# Patient Record
Sex: Male | Born: 1960 | Race: White | Hispanic: No | Marital: Married | Smoking: Former smoker
Health system: Southern US, Community
[De-identification: ages and names within clinical notes are randomized; demographics above are authoritative.]

## PROBLEM LIST (undated history)

## (undated) DIAGNOSIS — E119 Type 2 diabetes mellitus without complications: Secondary | ICD-10-CM

## (undated) DIAGNOSIS — K219 Gastro-esophageal reflux disease without esophagitis: Secondary | ICD-10-CM

## (undated) DIAGNOSIS — E785 Hyperlipidemia, unspecified: Secondary | ICD-10-CM

## (undated) DIAGNOSIS — Z95820 Peripheral vascular angioplasty status with implants and grafts: Secondary | ICD-10-CM

## (undated) DIAGNOSIS — I2510 Atherosclerotic heart disease of native coronary artery without angina pectoris: Secondary | ICD-10-CM

## (undated) DIAGNOSIS — I10 Essential (primary) hypertension: Secondary | ICD-10-CM

## (undated) DIAGNOSIS — G4730 Sleep apnea, unspecified: Secondary | ICD-10-CM

## (undated) DIAGNOSIS — Z77090 Contact with and (suspected) exposure to asbestos: Secondary | ICD-10-CM

## (undated) HISTORY — DX: Sleep apnea, unspecified: G47.30

## (undated) HISTORY — PX: CORONARY ANGIOPLASTY WITH STENT PLACEMENT: SHX49

## (undated) HISTORY — PX: ANKLE ARTHROSCOPY: SUR85

---

## 2000-08-19 ENCOUNTER — Encounter: Payer: Self-pay | Admitting: Family Medicine

## 2000-08-19 ENCOUNTER — Encounter: Admission: RE | Admit: 2000-08-19 | Discharge: 2000-08-19 | Payer: Self-pay | Admitting: Family Medicine

## 2008-06-01 ENCOUNTER — Encounter: Payer: Self-pay | Admitting: Internal Medicine

## 2008-07-05 ENCOUNTER — Encounter: Payer: Self-pay | Admitting: Internal Medicine

## 2008-07-09 ENCOUNTER — Ambulatory Visit: Payer: Self-pay | Admitting: Internal Medicine

## 2008-07-09 DIAGNOSIS — R05 Cough: Secondary | ICD-10-CM

## 2008-07-09 DIAGNOSIS — Z77090 Contact with and (suspected) exposure to asbestos: Secondary | ICD-10-CM

## 2008-07-09 DIAGNOSIS — F17200 Nicotine dependence, unspecified, uncomplicated: Secondary | ICD-10-CM

## 2008-07-09 DIAGNOSIS — R059 Cough, unspecified: Secondary | ICD-10-CM | POA: Insufficient documentation

## 2008-07-09 DIAGNOSIS — Z9189 Other specified personal risk factors, not elsewhere classified: Secondary | ICD-10-CM

## 2008-07-09 DIAGNOSIS — R0602 Shortness of breath: Secondary | ICD-10-CM

## 2008-07-15 ENCOUNTER — Encounter: Payer: Self-pay | Admitting: Internal Medicine

## 2008-08-20 ENCOUNTER — Ambulatory Visit: Payer: Self-pay | Admitting: Cardiology

## 2008-09-02 ENCOUNTER — Ambulatory Visit: Payer: Self-pay | Admitting: Internal Medicine

## 2008-09-02 DIAGNOSIS — J3489 Other specified disorders of nose and nasal sinuses: Secondary | ICD-10-CM | POA: Insufficient documentation

## 2010-04-11 ENCOUNTER — Telehealth: Payer: Self-pay | Admitting: Internal Medicine

## 2010-04-20 NOTE — Progress Notes (Signed)
Summary: return xray films to [facility]  Phone Note Outgoing Call   Summary of Call: MR had xray films from [facility] for this pt. I have given them to Juanita to have the carrieer return them to [facility].  Initial call taken by: Carron Curie CMA,  April 11, 2010 9:53 AM

## 2010-05-13 IMAGING — CT CT CHEST W/O CM
2 of 6 series · 12 of 36 positions shown, 15 images · non-contrast
Comparison: None

CLINICAL DATA: Shortness of breath, cough.

CT CHEST WITHOUT CONTRAST
TECHNIQUE: Multidetector CT imaging of the chest was performed
following the standard protocol without IV contrast.

[Series 5: lung · axial · 0.73mm/px · z∈[-118,+116]mm · 9 of 59 slices shown, 12 images]
[im 6/59  mediastinal]
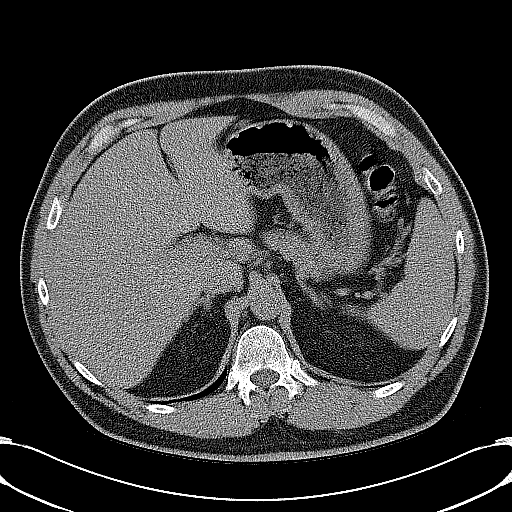
[im 6/59  lung]
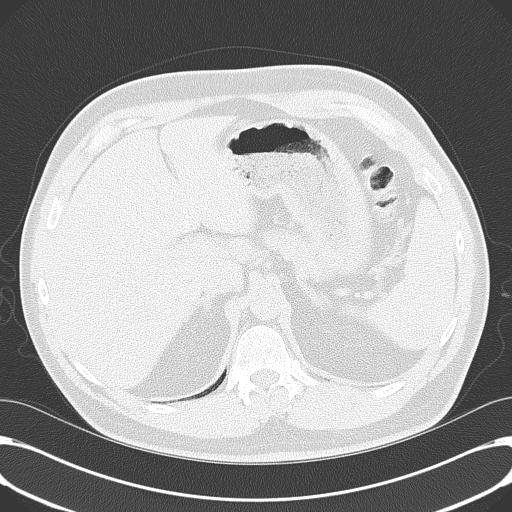
[im 12/59  lung]
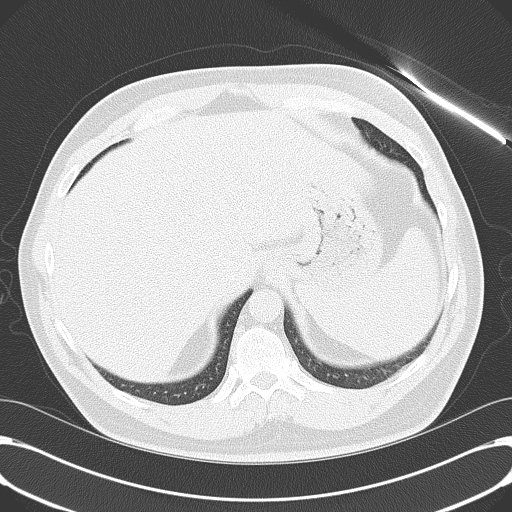
[im 18/59  lung]
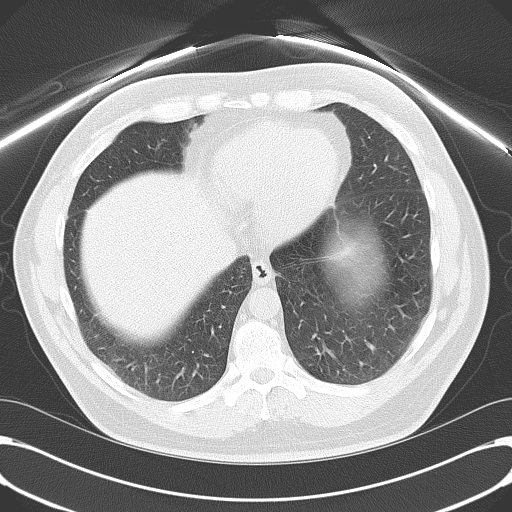
[im 24/59  lung]
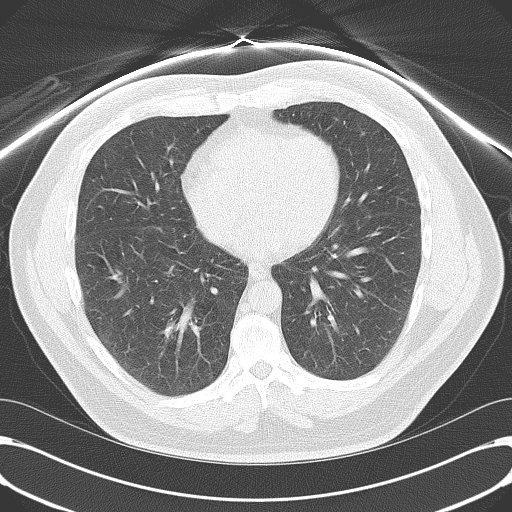
[im 30/59  mediastinal]
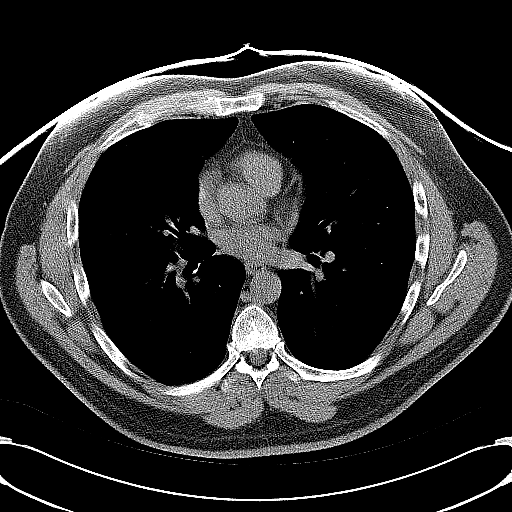
[im 30/59  lung]
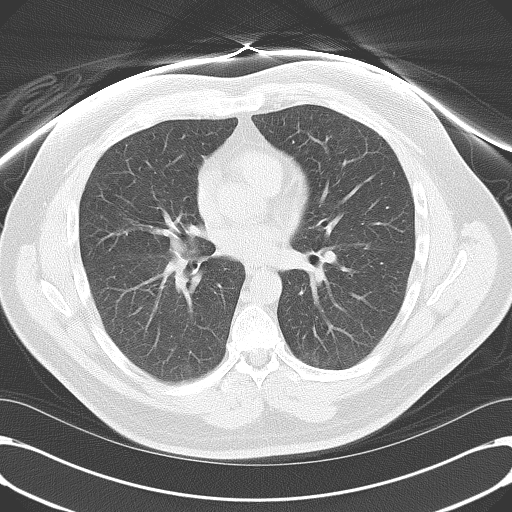
[im 35/59  lung]
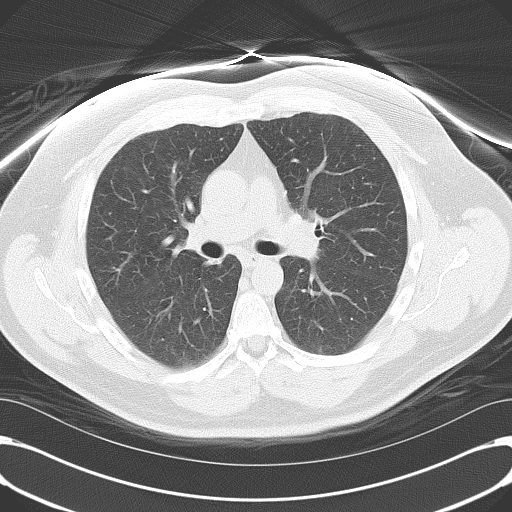
[im 41/59  lung]
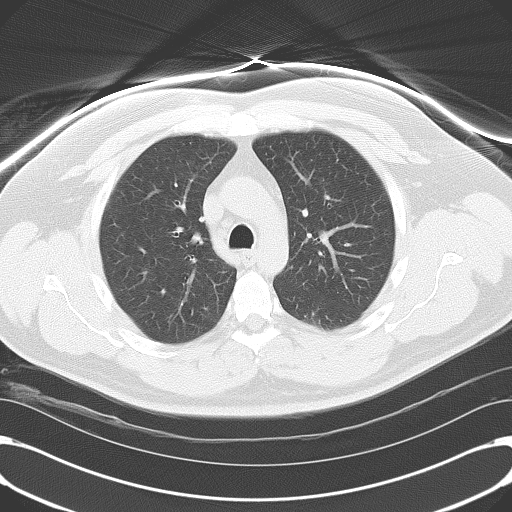
[im 47/59  lung]
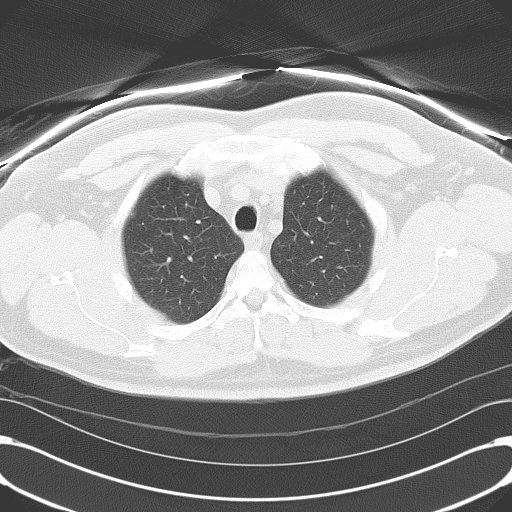
[im 53/59  mediastinal]
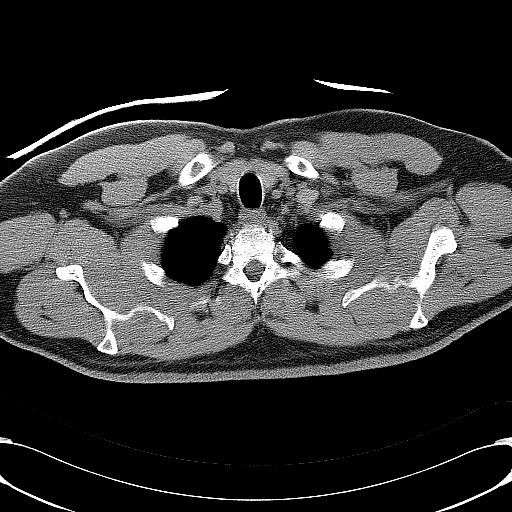
[im 53/59  lung]
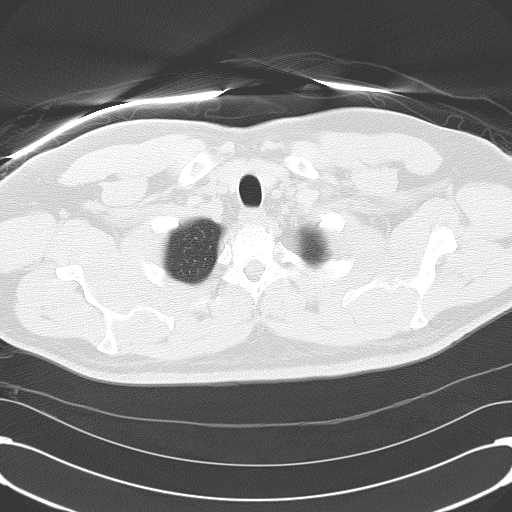

[Series 602: <mpr thick range> · coronal · 0.73mm/px · 3 of 126 slices shown]
[im 26/126  lung]
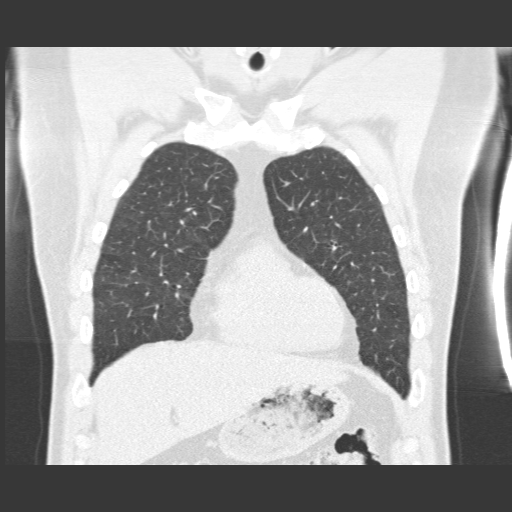
[im 51/126  lung]
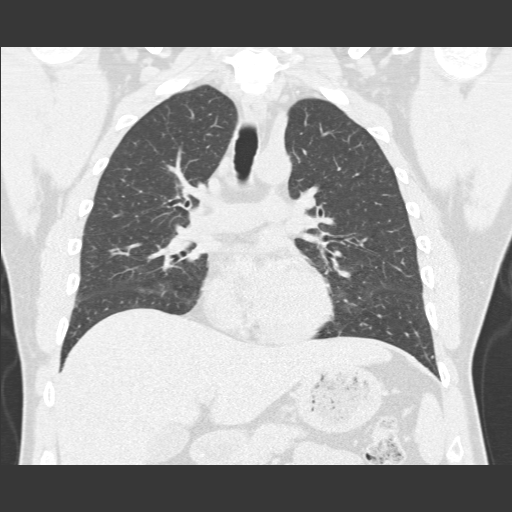
[im 76/126  lung]
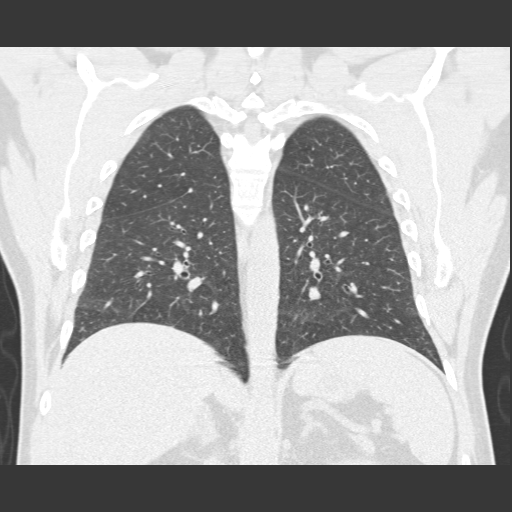

[12 of 36 positions shown; findings below may reference images not displayed]

FINDINGS: The lungs are clear except for minimal ground-glass
densities posteriorly, likely dependent atelectasis.  High
resolution images demonstrate no significant interstitial lung
disease. Heart is normal size. Aorta is normal caliber. No
mediastinal, hilar, or axillary adenopathy.  No pleural effusion.
Visualized thyroid and chest wall soft tissues unremarkable.

Imaging into the upper abdomen shows no acute findings.  No acute
bony abnormality.
IMPRESSION: No significant abnormality.

## 2015-11-24 ENCOUNTER — Emergency Department (HOSPITAL_COMMUNITY): Payer: 59

## 2015-11-24 ENCOUNTER — Encounter (HOSPITAL_COMMUNITY): Payer: Self-pay | Admitting: *Deleted

## 2015-11-24 ENCOUNTER — Inpatient Hospital Stay (HOSPITAL_COMMUNITY)
Admission: EM | Admit: 2015-11-24 | Discharge: 2015-11-26 | DRG: 247 | Disposition: A | Discharge status: Home / Self Care | Payer: 59 | Attending: Internal Medicine | Admitting: Internal Medicine

## 2015-11-24 DIAGNOSIS — I214 Non-ST elevation (NSTEMI) myocardial infarction: Principal | ICD-10-CM | POA: Diagnosis present

## 2015-11-24 DIAGNOSIS — Z8249 Family history of ischemic heart disease and other diseases of the circulatory system: Secondary | ICD-10-CM

## 2015-11-24 DIAGNOSIS — E119 Type 2 diabetes mellitus without complications: Secondary | ICD-10-CM | POA: Diagnosis present

## 2015-11-24 DIAGNOSIS — Z833 Family history of diabetes mellitus: Secondary | ICD-10-CM

## 2015-11-24 DIAGNOSIS — E1065 Type 1 diabetes mellitus with hyperglycemia: Secondary | ICD-10-CM | POA: Diagnosis not present

## 2015-11-24 DIAGNOSIS — Z77090 Contact with and (suspected) exposure to asbestos: Secondary | ICD-10-CM | POA: Diagnosis present

## 2015-11-24 DIAGNOSIS — E1069 Type 1 diabetes mellitus with other specified complication: Secondary | ICD-10-CM | POA: Diagnosis not present

## 2015-11-24 DIAGNOSIS — Z794 Long term (current) use of insulin: Secondary | ICD-10-CM

## 2015-11-24 DIAGNOSIS — R079 Chest pain, unspecified: Secondary | ICD-10-CM | POA: Diagnosis present

## 2015-11-24 DIAGNOSIS — K219 Gastro-esophageal reflux disease without esophagitis: Secondary | ICD-10-CM | POA: Diagnosis not present

## 2015-11-24 DIAGNOSIS — Z95820 Peripheral vascular angioplasty status with implants and grafts: Secondary | ICD-10-CM

## 2015-11-24 DIAGNOSIS — Z79899 Other long term (current) drug therapy: Secondary | ICD-10-CM

## 2015-11-24 DIAGNOSIS — I2510 Atherosclerotic heart disease of native coronary artery without angina pectoris: Secondary | ICD-10-CM | POA: Diagnosis present

## 2015-11-24 DIAGNOSIS — Z7982 Long term (current) use of aspirin: Secondary | ICD-10-CM

## 2015-11-24 DIAGNOSIS — IMO0001 Reserved for inherently not codable concepts without codable children: Secondary | ICD-10-CM | POA: Diagnosis present

## 2015-11-24 DIAGNOSIS — Z23 Encounter for immunization: Secondary | ICD-10-CM

## 2015-11-24 DIAGNOSIS — E1165 Type 2 diabetes mellitus with hyperglycemia: Secondary | ICD-10-CM

## 2015-11-24 DIAGNOSIS — F17200 Nicotine dependence, unspecified, uncomplicated: Secondary | ICD-10-CM | POA: Diagnosis present

## 2015-11-24 DIAGNOSIS — E781 Pure hyperglyceridemia: Secondary | ICD-10-CM | POA: Diagnosis present

## 2015-11-24 DIAGNOSIS — I10 Essential (primary) hypertension: Secondary | ICD-10-CM | POA: Diagnosis present

## 2015-11-24 HISTORY — DX: Essential (primary) hypertension: I10

## 2015-11-24 HISTORY — DX: Gastro-esophageal reflux disease without esophagitis: K21.9

## 2015-11-24 HISTORY — DX: Atherosclerotic heart disease of native coronary artery without angina pectoris: I25.10

## 2015-11-24 HISTORY — DX: Type 2 diabetes mellitus without complications: E11.9

## 2015-11-24 HISTORY — DX: Contact with and (suspected) exposure to asbestos: Z77.090

## 2015-11-24 HISTORY — DX: Hyperlipidemia, unspecified: E78.5

## 2015-11-24 HISTORY — DX: Peripheral vascular angioplasty status with implants and grafts: Z95.820

## 2015-11-24 LAB — TROPONIN I
Troponin I: 0.05 ng/mL (ref <0.03)
Troponin I: 0.05 ng/mL (ref <0.03)

## 2015-11-24 LAB — COMPREHENSIVE METABOLIC PANEL
ALBUMIN: 4.1 g/dL (ref 3.5–5.0)
ALK PHOS: 90 U/L (ref 38–126)
ALT: 16 U/L — ABNORMAL LOW (ref 17–63)
ANION GAP: 11 (ref 5–15)
AST: 18 U/L (ref 15–41)
BILIRUBIN TOTAL: 0.7 mg/dL (ref 0.3–1.2)
BUN: 13 mg/dL (ref 6–20)
CALCIUM: 9.2 mg/dL (ref 8.9–10.3)
CO2: 23 mmol/L (ref 22–32)
Chloride: 99 mmol/L — ABNORMAL LOW (ref 101–111)
Creatinine, Ser: 1.48 mg/dL — ABNORMAL HIGH (ref 0.61–1.24)
GFR calc Af Amer: >60 mL/min (ref >60)
GFR, EST NON AFRICAN AMERICAN: 52 mL/min — AB (ref >60)
GLUCOSE: 347 mg/dL — AB (ref 65–99)
Potassium: 5.3 mmol/L — ABNORMAL HIGH (ref 3.5–5.1)
Sodium: 133 mmol/L — ABNORMAL LOW (ref 135–145)
TOTAL PROTEIN: 6.6 g/dL (ref 6.5–8.1)

## 2015-11-24 LAB — LIPASE, BLOOD: LIPASE: 27 U/L (ref 11–51)

## 2015-11-24 LAB — CBC
HEMATOCRIT: 49 % (ref 39.0–52.0)
HEMOGLOBIN: 17.3 g/dL — AB (ref 13.0–17.0)
MCH: 30 pg (ref 26.0–34.0)
MCHC: 35.3 g/dL (ref 30.0–36.0)
MCV: 85.1 fL (ref 78.0–100.0)
Platelets: 178 10*3/uL (ref 150–400)
RBC: 5.76 MIL/uL (ref 4.22–5.81)
RDW: 13.4 % (ref 11.5–15.5)
WBC: 10 10*3/uL (ref 4.0–10.5)

## 2015-11-24 LAB — I-STAT TROPONIN, ED: Troponin i, poc: 0.01 ng/mL (ref 0.00–0.08)

## 2015-11-24 LAB — GLUCOSE, CAPILLARY
Glucose-Capillary: 264 mg/dL — ABNORMAL HIGH (ref 65–99)
Glucose-Capillary: 240 mg/dL — ABNORMAL HIGH (ref 65–99)

## 2015-11-24 MED ORDER — INSULIN ASPART 100 UNIT/ML ~~LOC~~ SOLN
0.0000 [IU] | Freq: Every day | SUBCUTANEOUS | Status: DC
  Administered 2015-11-24 – 2015-11-25 (×2): 2 [IU] via SUBCUTANEOUS

## 2015-11-24 MED ORDER — ASPIRIN 81 MG PO CHEW
324.0000 mg | CHEWABLE_TABLET | Freq: Once | ORAL | Status: AC
  Administered 2015-11-24: 324 mg via ORAL
  Filled 2015-11-24: qty 4

## 2015-11-24 MED ORDER — FAMOTIDINE 20 MG PO TABS
20.0000 mg | ORAL_TABLET | Freq: Two times a day (BID) | ORAL | Status: DC
  Filled 2015-11-24 (×4): qty 1

## 2015-11-24 MED ORDER — INSULIN GLARGINE 100 UNIT/ML ~~LOC~~ SOLN
25.0000 [IU] | Freq: Every day | SUBCUTANEOUS | Status: DC
  Administered 2015-11-24 – 2015-11-26 (×2): 25 [IU] via SUBCUTANEOUS
  Filled 2015-11-24 (×3): qty 0.25

## 2015-11-24 MED ORDER — ENOXAPARIN SODIUM 40 MG/0.4ML ~~LOC~~ SOLN: 40.0000 mg | SUBCUTANEOUS | Status: DC

## 2015-11-24 MED ORDER — NICOTINE 21 MG/24HR TD PT24
21.0000 mg | MEDICATED_PATCH | Freq: Every day | TRANSDERMAL | Status: DC
  Filled 2015-11-24 (×2): qty 1

## 2015-11-24 MED ORDER — ONDANSETRON HCL 4 MG/2ML IJ SOLN
4.0000 mg | Freq: Once | INTRAMUSCULAR | Status: AC
  Administered 2015-11-24: 4 mg via INTRAVENOUS
  Filled 2015-11-24: qty 2

## 2015-11-24 MED ORDER — GI COCKTAIL ~~LOC~~: 30.0000 mL | Freq: Once | ORAL | Status: DC

## 2015-11-24 MED ORDER — INSULIN GLARGINE-LIXISENATIDE 100-33 UNT-MCG/ML ~~LOC~~ SOPN: 25.0000 [IU] | PEN_INJECTOR | Freq: Every day | SUBCUTANEOUS | Status: DC

## 2015-11-24 MED ORDER — ONDANSETRON HCL 4 MG/2ML IJ SOLN: 4.0000 mg | Freq: Four times a day (QID) | INTRAMUSCULAR | Status: DC | PRN

## 2015-11-24 MED ORDER — GI COCKTAIL ~~LOC~~: 30.0000 mL | Freq: Four times a day (QID) | ORAL | Status: DC | PRN

## 2015-11-24 MED ORDER — INFLUENZA VAC SPLIT QUAD 0.5 ML IM SUSY: 0.5000 mL | PREFILLED_SYRINGE | INTRAMUSCULAR | Status: DC

## 2015-11-24 MED ORDER — ASPIRIN EC 81 MG PO TBEC
81.0000 mg | DELAYED_RELEASE_TABLET | Freq: Every day | ORAL | Status: DC
  Administered 2015-11-25 – 2015-11-26 (×2): 81 mg via ORAL
  Filled 2015-11-24 (×2): qty 1

## 2015-11-24 MED ORDER — ACETAMINOPHEN 325 MG PO TABS: 650.0000 mg | ORAL_TABLET | ORAL | Status: DC | PRN

## 2015-11-24 MED ORDER — SODIUM CHLORIDE 0.9 % IV SOLN
INTRAVENOUS | Status: DC
  Administered 2015-11-24: 18:00:00 via INTRAVENOUS

## 2015-11-24 MED ORDER — INSULIN ASPART 100 UNIT/ML ~~LOC~~ SOLN
0.0000 [IU] | Freq: Three times a day (TID) | SUBCUTANEOUS | Status: DC
Start: 2015-11-24 — End: 2015-11-26
  Administered 2015-11-24: 5 [IU] via SUBCUTANEOUS

## 2015-11-24 NOTE — ED Triage Notes (Signed)
Pt arrives from home via [facility]. Pt called out for substernal CP gradual onset at 0830. Pt states he attempted to take ASA, but had one episode of n/v and was  Unable to tolerate. Upon EMS arrival pt denied CP and states all other sx have resolved.

## 2015-11-24 NOTE — ED Provider Notes (Signed)
[facility]-EMERGENCY DEPT Provider Note   CSN: [id number] Arrival date & time: 11/24/15  [id number]     History   Chief Complaint Chief Complaint  Patient presents with  . Chest Pain    HPI Christopher Cooper is a 55 y.o. male.  The history is provided by the patient.  Chest Pain   This is a new problem. The current episode started 1 to 2 hours ago. The problem occurs constantly. The problem has been resolved. Associated with: following a BM. The pain is present in the substernal region. The pain is moderate. The quality of the pain is described as pressure-like. The pain does not radiate. Associated symptoms include diaphoresis, shortness of breath and vomiting. Pertinent negatives include no abdominal pain, no back pain, no cough, no fever, no headaches, no leg pain, no lower extremity edema, no malaise/fatigue, no nausea and no palpitations. Risk factors include male gender, being elderly and smoking/tobacco exposure.  His past medical history is significant for diabetes and hypertension.  Pertinent negatives for past medical history include no CAD, no hyperlipidemia, no MI, no PE and no seizures.  Procedure history is positive for exercise treadmill test (several years ago).    Past Medical History:  Diagnosis Date  . Hypertension     Patient Active Problem List   Diagnosis Date Noted  . Chest pain 11/24/2015  . Diabetes mellitus, insulin dependent (IDDM), uncontrolled (HCC) 11/24/2015  . GERD (gastroesophageal reflux disease) 11/24/2015  . OTHER DISEASES OF NASAL CAVITY AND SINUSES 09/02/2008  . TOBACCO ABUSE 07/09/2008  . SHORTNESS OF BREATH 07/09/2008  . COUGH 07/09/2008  . HISTORY OF ASBESTOS EXPOSURE 07/09/2008  . SNORING, HX OF 07/09/2008    History reviewed. No pertinent surgical history.     Home Medications    Prior to Admission medications   Medication Sig Start Date End Date Taking? Authorizing Provider  aspirin EC 81 MG tablet Take 81 mg by mouth daily.   Yes  Historical Provider, MD  Insulin Glargine-Lixisenatide (SOLIQUA) 100-33 UNT-MCG/ML SOPN Inject 25 Units into the skin daily.   Yes Historical Provider, MD  insulin lispro (HUMALOG) 100 UNIT/ML injection Inject 20 Units into the skin 2 (two) times daily.   Yes Historical Provider, MD  metFORMIN (GLUCOPHAGE) 500 MG tablet Take 500 mg by mouth 2 (two) times daily with a meal.   Yes Historical Provider, MD  ranitidine (ZANTAC) 150 MG capsule Take 150 mg by mouth daily as needed for heartburn.   Yes Historical Provider, MD  VIAGRA 100 MG tablet Take 100 mg by mouth daily as needed. 08/20/15  Yes Historical Provider, MD    Family History Family History  Problem Relation Age of Onset  . Diabetes Mellitus II Mother   . Diabetes Mellitus II Father   . CAD Other     Reports multiple distant relatives with history of MI    Social History Social History  Substance Use Topics  . Smoking status: Not on file  . Smokeless tobacco: Not on file  . Alcohol use Not on file     Allergies   Review of patient's allergies indicates no known allergies.   Review of Systems Review of Systems  Constitutional: Positive for diaphoresis. Negative for chills, fever and malaise/fatigue.  HENT: Negative for ear pain and sore throat.   Eyes: Negative for pain and visual disturbance.  Respiratory: Positive for shortness of breath. Negative for cough.   Cardiovascular: Positive for chest pain. Negative for palpitations.  Gastrointestinal: Positive for vomiting.  Negative for abdominal pain and nausea.  Genitourinary: Negative for dysuria and hematuria.  Musculoskeletal: Negative for arthralgias and back pain.  Skin: Negative for color change and rash.  Neurological: Negative for seizures, syncope and headaches.  All other systems reviewed and are negative.    Physical Exam Updated Vital Signs BP 122/87 (BP Location: Right Arm)   Pulse 89   Temp 98.1 F (36.7 C)   Resp 18   Ht 5\' 6"  (1.676 m)   Wt 180  lb (81.6 kg)   SpO2 92%   BMI 29.05 kg/m   Physical Exam  Constitutional: He is oriented to person, place, and time. He appears well-developed and well-nourished. No distress.  HENT:  Head: Normocephalic and atraumatic.  Nose: Nose normal.  Eyes: Conjunctivae and EOM are normal. Pupils are equal, round, and reactive to light. Right eye exhibits no discharge. Left eye exhibits no discharge. No scleral icterus.  Neck: Normal range of motion. Neck supple.  Cardiovascular: Normal rate and regular rhythm.  Exam reveals no gallop and no friction rub.   No murmur heard. Pulmonary/Chest: Effort normal and breath sounds normal. No stridor. No respiratory distress. He has no rales.  Abdominal: Soft. He exhibits no distension. There is no tenderness.  Musculoskeletal: He exhibits no edema or tenderness.  Neurological: He is alert and oriented to person, place, and time.  Skin: Skin is warm and dry. No rash noted. He is not diaphoretic. No erythema.  Psychiatric: He has a normal mood and affect.  Vitals reviewed.    ED Treatments / Results  Labs (all labs ordered are listed, but only abnormal results are displayed) Labs Reviewed  CBC - Abnormal; Notable for the following:       Result Value   Hemoglobin 17.3 (*)    All other components within normal limits  COMPREHENSIVE METABOLIC PANEL - Abnormal; Notable for the following:    Sodium 133 (*)    Potassium 5.3 (*)    Chloride 99 (*)    Glucose, Bld 347 (*)    Creatinine, Ser 1.48 (*)    ALT 16 (*)    GFR calc non Af Amer 52 (*)    All other components within normal limits  LIPASE, BLOOD  HEMOGLOBIN A1C  TROPONIN I  TROPONIN I  TROPONIN I  I-STAT TROPOININ, ED    EKG  EKG Interpretation  Date/Time:  Thursday November 24 2015 09:59:04 EDT Ventricular Rate:  80 PR Interval:    QRS Duration: 99 QT Interval:  375 QTC Calculation: 433 R Axis:   74 Text Interpretation:  Sinus rhythm Nonspecific T wave abnormality No old tracing  to compare Confirmed by [facility] MD, PEDRO [phone]) on 11/24/2015 4:05:06 PM       Radiology Dg Chest 2 View  Result Date: 11/24/2015 CLINICAL DATA:  Chest pain and nausea beginning this morning. EXAM: CHEST  2 VIEW COMPARISON:  Chest CT scan 08/20/2008. FINDINGS: The lungs are clear. No pneumothorax or pleural effusion. Heart size is normal. No focal bony abnormality. IMPRESSION: No acute disease. Electronically Signed   By: Drusilla Kanner M.D.   On: 11/24/2015 11:28    Procedures Procedures (including critical care time)  Medications Ordered in ED Medications  0.9 %  sodium chloride infusion (not administered)  insulin aspart (novoLOG) injection 0-9 Units (not administered)  insulin aspart (novoLOG) injection 0-5 Units (not administered)  nicotine (NICODERM CQ - dosed in mg/24 hours) patch 21 mg (not administered)  gi cocktail (Maalox,Lidocaine,Donnatal) (not administered)  aspirin chewable tablet 324 mg (324 mg Oral Given 11/24/15 1130)  ondansetron (ZOFRAN) injection 4 mg (4 mg Intravenous Given 11/24/15 1130)     Initial Impression / Assessment and Plan / ED Course  I have reviewed the triage vital signs and the nursing notes.  Pertinent labs & imaging results that were available during my care of the patient were reviewed by me and considered in my medical decision making (see chart for details).  Clinical Course    Atypical chest pain. EKG with nonspecific T wave changes; no evidence of pericarditis. Initial Trop negative. HEAR >4. Will require admission for ACS rule out. Pain free on arrival. Given ASA.   Chest x-ray without evidence suggestive of pneumonia, pneumothorax, pneumomediastinum.  No abnormal contour of the mediastinum to suggest dissection. No evidence of acute injuries. Presentation is classic for dissection or esophageal perforation. Low pretest probability for pulmonary embolism.  Appreciate admission to hospitalist service for continued workup.  Final  Clinical Impressions(s) / ED Diagnoses   Final diagnoses:  Chest pain    Disposition: Admit  Condition: Stable    Nira Conn, MD 11/24/15 1606

## 2015-11-24 NOTE — H&P (Signed)
History and Physical    Christopher ForemanRiley D Kley NWG:[id number] DOB: 02/25/1960 DOA: 11/24/2015   PCP: Pearson GrippeJames Kim, MD   Patient coming from/Resides with: Private residence/lives with wife  Admission status: Observation/telemetry --will need to be reevaluated in 24 hours to determine if it will be medically necessary to stay a minimum 2 midnights to rule out impending and/or unexpected changes in physiologic status that may differ from initial evaluation performed in the ER and/or at time of admission. Patient admitted with atypical chest pain of short duration with heart score of 3. Cardiac risk factors include ongoing tobacco abuse, diabetes and male sex. Plan to cycle troponin and perform echocardiogram; if no significant findings will discharge within 24 hours.  Chief Complaint: Chest pain  HPI: Christopher Cooper is a 55 y.o. male with medical history significant for diabetes on insulin, ongoing tobacco abuse, severe GERD, history of exposure to asbestos at work remotely. Patient reports that this morning he got out of bed quickly to go to the bathroom and began having a bowel movement when he began having discomfort in the upper chest just below the neck that did not radiate. He reports "not feeling right" and decided to check his fitness watch which documented a heart rate of 42. At this point he became diaphoretic. He got off the toilet and called EMS who instructed him to chew 4 baby aspirin at which point he became nauseous and then developed emesis. After vomiting he became somewhat short of breath. He states the chest pain lasts about 15-20 minutes. He has not had chest pain with either exertional or rest nor dyspnea with exertion or rest prior to today's episode. EMS was called to the home and patient was transported to the ER where he was chest pain-free and EKG was unremarkable.  ED Course:  Vital Signs: BP 102/63   Pulse 69   Temp 98.1 F (36.7 C)   Resp 16   Ht 5\' 6"  (1.676 m)   Wt 81.6 kg  (180 lb)   SpO2 94%   BMI 29.05 kg/m  2 view CXR: No acute disease Lab data: Sodium 133, potassium 5.3, chloride 99, BUN 13, creatinine 1.48, glucose 347, LFTs normal, POC troponin 0.01, W BCs 10,000 differential not obtained, hemoglobin 17.3, platelets 178,000 Medications and treatments: Aspirin 324 mg 1, Zofran 4 mg IV 1  Review of Systems:  In addition to the HPI above,  No Fever-chills, myalgias or other constitutional symptoms No Headache, changes with Vision or hearing, new weakness, tingling, numbness in any extremity, dizziness, dysarthria or word finding difficulty, gait disturbance or imbalance, tremors or seizure activity No problems swallowing food or Liquids, choking or coughing while eating, abdominal pain with or after eating No Cough or Shortness of Breath, palpitations, orthopnea or DOE No Abdominal pain, melena,hematochezia, dark tarry stools, constipation No dysuria, malodorous urine, hematuria or flank pain No new skin rashes, lesions, masses or bruises, No new joint pains, aches, swelling or redness No recent unintentional weight gain or loss No polyuria, polydypsia or polyphagia   Past Medical History:  Diagnosis Date  . Hypertension     History reviewed. No pertinent surgical history.  Social History   Social History  . Marital status: Married    Spouse name: N/A  . Number of children: N/A  . Years of education: N/A   Occupational History  . Not on file.   Social History Main Topics  . Smoking status: Not on file  . Smokeless tobacco: Not on  file  . Alcohol use Not on file  . Drug use: Unknown  . Sexual activity: Not on file   Other Topics Concern  . Not on file   Social History Narrative  . 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 Diabetes Mellitus II Mother   . Diabetes Mellitus II Father    . CAD Other     Reports multiple distant relatives with history of MI     Prior to Admission medications   Medication Sig Start Date End Date Taking? Authorizing Provider  aspirin EC 81 MG tablet Take 81 mg by mouth daily.   Yes Historical Provider, MD  Insulin Glargine-Lixisenatide (SOLIQUA) 100-33 UNT-MCG/ML SOPN Inject 25 Units into the skin daily.   Yes Historical Provider, MD  insulin lispro (HUMALOG) 100 UNIT/ML injection Inject 20 Units into the skin 2 (two) times daily.   Yes Historical Provider, MD  metFORMIN (GLUCOPHAGE) 500 MG tablet Take 500 mg by mouth 2 (two) times daily with a meal.   Yes Historical Provider, MD  ranitidine (ZANTAC) 150 MG capsule Take 150 mg by mouth daily as needed for heartburn.   Yes Historical Provider, MD  VIAGRA 100 MG tablet Take 100 mg by mouth daily as needed. 08/20/15  Yes Historical Provider, MD    Physical Exam: Vitals:   11/24/15 1300 11/24/15 1400 11/24/15 1440 11/24/15 1500  BP: 119/80 111/75 104/64 102/63  Pulse: 79 72 72 69  Resp:  16 16   Temp:      SpO2: 95% 96% 95% 94%  Weight:      Height:          Constitutional: NAD, calm, comfortable Eyes: PERRL, lids and conjunctivae normal ENMT: Mucous membranes are moist. Posterior pharynx clear of any exudate or lesions.Normal dentition.  Neck: normal, supple, no masses, no thyromegaly Respiratory: clear to auscultation bilaterally, no wheezing, no crackles. Normal respiratory effort. No accessory muscle use.  Cardiovascular: Regular rate and rhythm, no murmurs / rubs / gallops. No extremity edema. 2+ pedal pulses. No carotid bruits.  Abdomen: no tenderness, no masses palpated. No hepatosplenomegaly. Bowel sounds positive.  Musculoskeletal: no clubbing / cyanosis. No joint deformity upper and lower extremities. Good ROM, no contractures. Normal muscle tone.  Skin: no rashes, lesions, ulcers. No induration Neurologic: CN 2-12 grossly intact. Sensation intact, DTR normal. Strength 5/5 x  all 4 extremities.  Psychiatric: Normal judgment and insight. Alert and oriented x 3. Normal mood.    Labs on Admission: I have personally reviewed following labs and imaging studies  CBC:  Recent Labs Lab 11/24/15 1008  WBC 10.0  HGB 17.3*  HCT 49.0  MCV 85.1  PLT 178   Basic Metabolic Panel:  Recent Labs Lab 11/24/15 1008  NA 133*  K 5.3*  CL 99*  CO2 23  GLUCOSE 347*  BUN 13  CREATININE 1.48*  CALCIUM 9.2   GFR: Estimated Creatinine Clearance: 57.9 mL/min (by C-G formula based on SCr of 1.48 mg/dL (H)). Liver Function Tests:  Recent Labs Lab 11/24/15 1008  AST 18  ALT 16*  ALKPHOS 90  BILITOT 0.7  PROT 6.6  ALBUMIN 4.1    Recent Labs Lab 11/24/15 1008  LIPASE 27   No results for input(s): AMMONIA in the last 168 hours. Coagulation Profile: No results for input(s): INR, PROTIME in the last  168 hours. Cardiac Enzymes: No results for input(s): CKTOTAL, CKMB, CKMBINDEX, TROPONINI in the last 168 hours. BNP (last 3 results) No results for input(s): PROBNP in the last 8760 hours. HbA1C: No results for input(s): HGBA1C in the last 72 hours. CBG: No results for input(s): GLUCAP in the last 168 hours. Lipid Profile: No results for input(s): CHOL, HDL, LDLCALC, TRIG, CHOLHDL, LDLDIRECT in the last 72 hours. Thyroid Function Tests: No results for input(s): TSH, T4TOTAL, FREET4, T3FREE, THYROIDAB in the last 72 hours. Anemia Panel: No results for input(s): VITAMINB12, FOLATE, FERRITIN, TIBC, IRON, RETICCTPCT in the last 72 hours. Urine analysis: No results found for: COLORURINE, APPEARANCEUR, LABSPEC, PHURINE, GLUCOSEU, HGBUR, BILIRUBINUR, KETONESUR, PROTEINUR, UROBILINOGEN, NITRITE, LEUKOCYTESUR Sepsis Labs: @LABRCNTIP (procalcitonin:4,lacticidven:4) )No results found for this or any previous visit (from the past 240 hour(s)).   Radiological Exams on Admission: Dg Chest 2 View  Result Date: 11/24/2015 CLINICAL DATA:  Chest pain and nausea beginning  this morning. EXAM: CHEST  2 VIEW COMPARISON:  Chest CT scan 08/20/2008. FINDINGS: The lungs are clear. No pneumothorax or pleural effusion. Heart size is normal. No focal bony abnormality. IMPRESSION: No acute disease. Electronically Signed   By: Drusilla Kanner M.D.   On: 11/24/2015 11:28    EKG: (Independently reviewed) sinus rhythm with ventricular rate 80 bpm, QTC 43 ms, no ST segment elevation or T-wave inversion noted concerning for acute ischemic changes  Assessment/Plan Principal Problem:   Chest pain -Patient presents with one episode of upper chest pain that began after passing a bowel movement with concurrent drop in heart rate to 42 bpm suspicious for vagal activity secondary to passage of bowel movement (in addition to chest pain patient also became diaphoretic then experienced nausea with vomiting) -Heart score equals 3 -Cardiac risk factors include diabetes, tobacco abuse, male sex -Cycle troponin -Echocardiogram -Patient reports at least 5 years ago outpatient treadmill stress test which was negative -Lipid panel in a.m. -Continue preadmission baby aspirin -Patient has history of GERD as well and symptoms concerning for possible GI etiology (see below)  Active Problems:   Diabetes mellitus, insulin dependent (IDDM), uncontrolled  -Patient reports improving hemoglobin A1c's but not well controlled in the outpatient setting -Glucose at presentation 327 -Therapeutic substitution of Lantus for [facility] which is not available here at this facility-discuss with pharmacy regarding appropriate dosage -Provide SSI -Hold Glucophage in the acute setting -Check hemoglobin A1c    GERD (gastroesophageal reflux disease) -On Zantac as needed prior to admission -Pepcid twice a day while here -GI cocktail now and prn    TOBACCO ABUSE -Counseled regarding cessation -Nicotine patch      DVT prophylaxis: Lovenox Code Status: Full  Family Communication: No family at bedside at  time of admission Disposition Plan: Anticipate discharge preadmission home environment once medically stable / within the next 24 hours Consults called: None    Rande Dario L. ANP-BC [facility] Pager [phone]   If 7PM-7AM, please contact night-coverage [email or web address] Password [email or web address]  11/24/2015, 4:00 PM

## 2015-11-24 NOTE — ED Notes (Signed)
Spoke to lab trop 0.05 CL

## 2015-11-25 ENCOUNTER — Encounter (HOSPITAL_COMMUNITY): Payer: Self-pay | Admitting: Physician Assistant

## 2015-11-25 ENCOUNTER — Other Ambulatory Visit (HOSPITAL_COMMUNITY): Payer: 59

## 2015-11-25 ENCOUNTER — Encounter (HOSPITAL_COMMUNITY)
Admission: EM | Disposition: A | Discharge status: Home / Self Care | Payer: Self-pay | Source: Home / Self Care | Attending: Internal Medicine

## 2015-11-25 DIAGNOSIS — K219 Gastro-esophageal reflux disease without esophagitis: Secondary | ICD-10-CM | POA: Diagnosis present

## 2015-11-25 DIAGNOSIS — I10 Essential (primary) hypertension: Secondary | ICD-10-CM | POA: Diagnosis present

## 2015-11-25 DIAGNOSIS — F17200 Nicotine dependence, unspecified, uncomplicated: Secondary | ICD-10-CM | POA: Diagnosis present

## 2015-11-25 DIAGNOSIS — E781 Pure hyperglyceridemia: Secondary | ICD-10-CM | POA: Diagnosis present

## 2015-11-25 DIAGNOSIS — Z7982 Long term (current) use of aspirin: Secondary | ICD-10-CM | POA: Diagnosis not present

## 2015-11-25 DIAGNOSIS — Z8249 Family history of ischemic heart disease and other diseases of the circulatory system: Secondary | ICD-10-CM | POA: Diagnosis not present

## 2015-11-25 DIAGNOSIS — E119 Type 2 diabetes mellitus without complications: Secondary | ICD-10-CM | POA: Diagnosis present

## 2015-11-25 DIAGNOSIS — E1065 Type 1 diabetes mellitus with hyperglycemia: Secondary | ICD-10-CM

## 2015-11-25 DIAGNOSIS — Z794 Long term (current) use of insulin: Secondary | ICD-10-CM | POA: Diagnosis not present

## 2015-11-25 DIAGNOSIS — I2510 Atherosclerotic heart disease of native coronary artery without angina pectoris: Secondary | ICD-10-CM | POA: Diagnosis present

## 2015-11-25 DIAGNOSIS — Z23 Encounter for immunization: Secondary | ICD-10-CM | POA: Diagnosis not present

## 2015-11-25 DIAGNOSIS — Z833 Family history of diabetes mellitus: Secondary | ICD-10-CM | POA: Diagnosis not present

## 2015-11-25 DIAGNOSIS — Z79899 Other long term (current) drug therapy: Secondary | ICD-10-CM | POA: Diagnosis not present

## 2015-11-25 DIAGNOSIS — R079 Chest pain, unspecified: Secondary | ICD-10-CM | POA: Diagnosis present

## 2015-11-25 DIAGNOSIS — I214 Non-ST elevation (NSTEMI) myocardial infarction: Secondary | ICD-10-CM | POA: Diagnosis present

## 2015-11-25 DIAGNOSIS — Z77090 Contact with and (suspected) exposure to asbestos: Secondary | ICD-10-CM | POA: Diagnosis present

## 2015-11-25 DIAGNOSIS — E1069 Type 1 diabetes mellitus with other specified complication: Secondary | ICD-10-CM

## 2015-11-25 HISTORY — PX: CARDIAC CATHETERIZATION: SHX172

## 2015-11-25 LAB — LIPID PANEL
CHOLESTEROL: 161 mg/dL (ref 0–200)
HDL: 32 mg/dL — ABNORMAL LOW (ref >40)
LDL Cholesterol: 67 mg/dL (ref 0–99)
TRIGLYCERIDES: 309 mg/dL — AB (ref <150)
Total CHOL/HDL Ratio: 5 RATIO
VLDL: 62 mg/dL — ABNORMAL HIGH (ref 0–40)

## 2015-11-25 LAB — BASIC METABOLIC PANEL
Anion gap: 7 (ref 5–15)
BUN: 11 mg/dL (ref 6–20)
CALCIUM: 8.6 mg/dL — AB (ref 8.9–10.3)
CHLORIDE: 103 mmol/L (ref 101–111)
CO2: 26 mmol/L (ref 22–32)
CREATININE: 1.32 mg/dL — AB (ref 0.61–1.24)
Glucose, Bld: 188 mg/dL — ABNORMAL HIGH (ref 65–99)
Potassium: 3.6 mmol/L (ref 3.5–5.1)
SODIUM: 136 mmol/L (ref 135–145)

## 2015-11-25 LAB — GLUCOSE, CAPILLARY
GLUCOSE-CAPILLARY: 148 mg/dL — AB (ref 65–99)
GLUCOSE-CAPILLARY: 111 mg/dL — AB (ref 65–99)
GLUCOSE-CAPILLARY: 289 mg/dL — AB (ref 65–99)
Glucose-Capillary: 171 mg/dL — ABNORMAL HIGH (ref 65–99)

## 2015-11-25 LAB — CBC
HCT: 46.7 % (ref 39.0–52.0)
HEMOGLOBIN: 16.2 g/dL (ref 13.0–17.0)
MCH: 29.7 pg (ref 26.0–34.0)
MCHC: 34.7 g/dL (ref 30.0–36.0)
MCV: 85.5 fL (ref 78.0–100.0)
PLATELETS: 186 10*3/uL (ref 150–400)
RBC: 5.46 MIL/uL (ref 4.22–5.81)
RDW: 13.6 % (ref 11.5–15.5)
WBC: 9.5 10*3/uL (ref 4.0–10.5)

## 2015-11-25 LAB — PROTIME-INR
INR: 1.04
PROTHROMBIN TIME: 13.7 s (ref 11.4–15.2)

## 2015-11-25 LAB — TROPONIN I: Troponin I: 0.07 ng/mL (ref <0.03)

## 2015-11-25 LAB — HEMOGLOBIN A1C
HEMOGLOBIN A1C: 9 % — AB (ref 4.8–5.6)
MEAN PLASMA GLUCOSE: 212 mg/dL

## 2015-11-25 LAB — POCT ACTIVATED CLOTTING TIME: ACTIVATED CLOTTING TIME: 307 s

## 2015-11-25 SURGERY — LEFT HEART CATH AND CORONARY ANGIOGRAPHY

## 2015-11-25 MED ORDER — TICAGRELOR 90 MG PO TABS
ORAL_TABLET | ORAL | Status: AC
  Filled 2015-11-25: qty 1

## 2015-11-25 MED ORDER — NITROGLYCERIN 1 MG/10 ML FOR IR/CATH LAB
INTRA_ARTERIAL | Status: AC
  Filled 2015-11-25: qty 10

## 2015-11-25 MED ORDER — MIDAZOLAM HCL 2 MG/2ML IJ SOLN
INTRAMUSCULAR | Status: DC | PRN
  Administered 2015-11-25 (×2): 1 mg via INTRAVENOUS

## 2015-11-25 MED ORDER — SODIUM CHLORIDE 0.9% FLUSH: 3.0000 mL | INTRAVENOUS | Status: DC | PRN

## 2015-11-25 MED ORDER — FENTANYL CITRATE (PF) 100 MCG/2ML IJ SOLN
INTRAMUSCULAR | Status: DC | PRN
  Administered 2015-11-25 (×2): 25 ug via INTRAVENOUS

## 2015-11-25 MED ORDER — ATORVASTATIN CALCIUM 80 MG PO TABS: 80.0000 mg | ORAL_TABLET | Freq: Every day | ORAL | Status: DC

## 2015-11-25 MED ORDER — LIDOCAINE HCL (PF) 1 % IJ SOLN
INTRAMUSCULAR | Status: DC | PRN
  Administered 2015-11-25: 2 mL via INTRADERMAL

## 2015-11-25 MED ORDER — MIDAZOLAM HCL 2 MG/2ML IJ SOLN
INTRAMUSCULAR | Status: AC
  Filled 2015-11-25: qty 2

## 2015-11-25 MED ORDER — SODIUM CHLORIDE 0.9% FLUSH: 3.0000 mL | Freq: Two times a day (BID) | INTRAVENOUS | Status: DC

## 2015-11-25 MED ORDER — SODIUM CHLORIDE 0.9 % IV SOLN: 250.0000 mL | INTRAVENOUS | Status: DC | PRN

## 2015-11-25 MED ORDER — VERAPAMIL HCL 2.5 MG/ML IV SOLN
INTRAVENOUS | Status: DC | PRN
  Administered 2015-11-25: 18:00:00 via INTRA_ARTERIAL

## 2015-11-25 MED ORDER — TICAGRELOR 90 MG PO TABS
90.0000 mg | ORAL_TABLET | Freq: Two times a day (BID) | ORAL | Status: DC
  Administered 2015-11-26 (×2): 90 mg via ORAL
  Filled 2015-11-25 (×2): qty 1

## 2015-11-25 MED ORDER — HEART ATTACK BOUNCING BOOK
Freq: Once | Status: AC
  Administered 2015-11-25: 20:00:00
  Filled 2015-11-25: qty 1

## 2015-11-25 MED ORDER — LIDOCAINE HCL (PF) 1 % IJ SOLN
INTRAMUSCULAR | Status: AC
  Filled 2015-11-25: qty 30

## 2015-11-25 MED ORDER — IOPAMIDOL (ISOVUE-370) INJECTION 76%
INTRAVENOUS | Status: AC
  Filled 2015-11-25: qty 100

## 2015-11-25 MED ORDER — SODIUM CHLORIDE 0.9 % WEIGHT BASED INFUSION
3.0000 mL/kg/h | INTRAVENOUS | Status: AC
  Administered 2015-11-25: 3 mL/kg/h via INTRAVENOUS

## 2015-11-25 MED ORDER — VERAPAMIL HCL 2.5 MG/ML IV SOLN
INTRAVENOUS | Status: AC
  Filled 2015-11-25: qty 2

## 2015-11-25 MED ORDER — HEPARIN SODIUM (PORCINE) 1000 UNIT/ML IJ SOLN
INTRAMUSCULAR | Status: DC | PRN
  Administered 2015-11-25: 4000 [IU] via INTRAVENOUS
  Administered 2015-11-25: 3000 [IU] via INTRAVENOUS

## 2015-11-25 MED ORDER — HEPARIN (PORCINE) IN NACL 2-0.9 UNIT/ML-% IJ SOLN
INTRAMUSCULAR | Status: DC | PRN
  Administered 2015-11-25: 1000 mL via INTRA_ARTERIAL

## 2015-11-25 MED ORDER — TICAGRELOR 90 MG PO TABS
ORAL_TABLET | ORAL | Status: DC | PRN
  Administered 2015-11-25: 180 mg via ORAL

## 2015-11-25 MED ORDER — ASPIRIN 81 MG PO CHEW
324.0000 mg | CHEWABLE_TABLET | ORAL | Status: AC
  Administered 2015-11-25: 324 mg via ORAL

## 2015-11-25 MED ORDER — NITROGLYCERIN 1 MG/10 ML FOR IR/CATH LAB
INTRA_ARTERIAL | Status: DC | PRN
  Administered 2015-11-25: 200 ug via INTRA_ARTERIAL

## 2015-11-25 MED ORDER — SODIUM CHLORIDE 0.9 % WEIGHT BASED INFUSION: 3.0000 mL/kg/h | INTRAVENOUS | Status: DC

## 2015-11-25 MED ORDER — ANGIOPLASTY BOOK
Freq: Once | Status: AC
  Administered 2015-11-25: 20:00:00
  Filled 2015-11-25: qty 1

## 2015-11-25 MED ORDER — SODIUM CHLORIDE 0.9% FLUSH
3.0000 mL | Freq: Two times a day (BID) | INTRAVENOUS | Status: DC
  Administered 2015-11-25: 20:00:00 3 mL via INTRAVENOUS

## 2015-11-25 MED ORDER — HEPARIN SODIUM (PORCINE) 1000 UNIT/ML IJ SOLN
INTRAMUSCULAR | Status: AC
  Filled 2015-11-25: qty 1

## 2015-11-25 MED ORDER — HEPARIN (PORCINE) IN NACL 2-0.9 UNIT/ML-% IJ SOLN
INTRAMUSCULAR | Status: AC
  Filled 2015-11-25: qty 1000

## 2015-11-25 MED ORDER — IOPAMIDOL (ISOVUE-370) INJECTION 76%
INTRAVENOUS | Status: DC | PRN
  Administered 2015-11-25: 110 mL via INTRA_ARTERIAL

## 2015-11-25 MED ORDER — FENTANYL CITRATE (PF) 100 MCG/2ML IJ SOLN
INTRAMUSCULAR | Status: AC
  Filled 2015-11-25: qty 2

## 2015-11-25 MED ORDER — SODIUM CHLORIDE 0.9 % WEIGHT BASED INFUSION: 1.0000 mL/kg/h | INTRAVENOUS | Status: DC

## 2015-11-25 SURGICAL SUPPLY — 17 items
BALLN EMERGE MR 2.5X15 (BALLOONS) ×3
BALLN ~~LOC~~ TREK RX 3.25X12 (BALLOONS) ×3
BALLOON EMERGE MR 2.5X15 (BALLOONS) ×1 IMPLANT
BALLOON ~~LOC~~ TREK RX 3.25X12 (BALLOONS) ×1 IMPLANT
CATH 5FR JL3.5 JR4 ANG PIG MP (CATHETERS) ×3 IMPLANT
CATH VISTA GUIDE 6FR JR4 (CATHETERS) ×3 IMPLANT
DEVICE RAD COMP TR BAND LRG (VASCULAR PRODUCTS) ×3 IMPLANT
GLIDESHEATH SLEND SS 6F .021 (SHEATH) ×3 IMPLANT
KIT HEART LEFT (KITS) ×3 IMPLANT
PACK CARDIAC CATHETERIZATION (CUSTOM PROCEDURE TRAY) ×3 IMPLANT
STENT PROMUS PREM MR 3.0X16 (Permanent Stent) ×3 IMPLANT
SYR MEDRAD MARK V 150ML (SYRINGE) ×3 IMPLANT
TRANSDUCER W/STOPCOCK (MISCELLANEOUS) ×3 IMPLANT
TUBING CIL FLEX 10 FLL-RA (TUBING) ×3 IMPLANT
WIRE HI TORQ VERSACORE-J 145CM (WIRE) ×3 IMPLANT
WIRE MARVEL STR TIP 190CM (WIRE) ×2 IMPLANT
WIRE SAFE-T 1.5MM-J .035X260CM (WIRE) ×3 IMPLANT

## 2015-11-25 NOTE — Consult Note (Signed)
CARDIOLOGY CONSULT NOTE   Patient ID: Christopher Cooper MRN: [id number] DOB/AGE: 53/09/1960 53 y.o.  Admit date: 11/24/2015  Primary Physician   Pearson GrippeJames Kim, MD Primary Cardiologist   New Reason for Consultation   Chest pain Requesting MD: Dr Marland McalpineSheikh  UXL:[id number]:Christopher Cooper is a 55 y.o. year old male with a history of DM, Tob use, GERD, remote Asbestos exposure. He was admitted 10/12 for chest pain.  The chest pain started while sitting in the bathroom, stood up quickly and had a pain int the middle of his chest 3/10. Aching pain, thought it was a muscle pull.He went outside, was diaphoretic and the diaphoresis got worse. He was told to take ASA 324 mg, which he vomited up.  Before he felt the N&V, he checked his pulse and it was 42. He was not dizzy at that time.  He then got SOB. After [facility] got there, he had some dizziness. After EMS got there, the CP resolved. He was given O2 by mask by [facility] and the SOB, diaphoresis and dizziness all improved.    He has never known his heart rate to be that low before. He has never had non-traumatic or non-over-exertional pain before.    He was admitted overnight, the symptoms have not returned, he feels well. He is routinely up and down ladders at work carrying boxes and never has CP or SOB from that. He does not exercise but does things around the house.    Past Medical History:  Diagnosis Date  . Asbestos exposure   . Diabetes (HCC)   . GERD (gastroesophageal reflux disease)   . Hypertension      Past Surgical History:  Procedure Laterality Date  . ANKLE ARTHROSCOPY Left    after injury    No Known Allergies  I have reviewed the patient's current medications . aspirin EC  81 mg Oral Daily  . enoxaparin (LOVENOX) injection  40 mg Subcutaneous Q24H  . famotidine  20 mg Oral BID  . gi cocktail  30 mL Oral Once  . Influenza vac split quadrivalent PF  0.5 mL Intramuscular Tomorrow-1000  . insulin aspart  0-5 Units Subcutaneous QHS  .  insulin aspart  0-9 Units Subcutaneous TID WC  . insulin glargine  25 Units Subcutaneous Daily  . nicotine  21 mg Transdermal Daily   . sodium chloride 75 mL/hr at 11/24/15 1739   acetaminophen, gi cocktail, ondansetron (ZOFRAN) IV  Prior to Admission medications   Medication Sig Start Date End Date Taking? Authorizing Provider  aspirin EC 81 MG tablet Take 81 mg by mouth daily.   Yes Historical Provider, MD  Insulin Glargine-Lixisenatide (SOLIQUA) 100-33 UNT-MCG/ML SOPN Inject 25 Units into the skin daily.   Yes Historical Provider, MD  insulin lispro (HUMALOG) 100 UNIT/ML injection Inject 20 Units into the skin 2 (two) times daily.   Yes Historical Provider, MD  metFORMIN (GLUCOPHAGE) 500 MG tablet Take 500 mg by mouth 2 (two) times daily with a meal.   Yes Historical Provider, MD  ranitidine (ZANTAC) 150 MG capsule Take 150 mg by mouth daily as needed for heartburn.   Yes Historical Provider, MD  VIAGRA 100 MG tablet Take 100 mg by mouth daily as needed. 08/20/15  Yes Historical Provider, MD     Social History   Social History  . Marital status: Married    Spouse name: N/A  . Number of children: N/A  . Years of education: N/A   Occupational  History  . Store Production designer, theatre/television/film at [employer]    Social History Main Topics  . Smoking status: Current Every Day Smoker    Packs/day: 0.25    Types: Cigarettes  . Smokeless tobacco: Never Used  . Alcohol use No  . Drug use: No  . Sexual activity: Yes    Birth control/ protection: Other-see comments   Other Topics Concern  . Not on file   Social History Narrative   He lives in [place] with his wife.    Family Status  Relation Status  . Mother Alive  . Father Alive  . Other    Family History  Problem Relation Age of Onset  . Diabetes Mellitus II Mother   . Diabetes Mellitus II Father   . CAD Other     Reports multiple distant relatives with history of MI     ROS:  Full 14 point review of systems complete and found to be  negative unless listed above.  Physical Exam: Blood pressure 123/85, pulse 77, temperature 98.2 F (36.8 C), temperature source Oral, resp. rate 16, height 5\' 6"  (1.676 m), weight 178 lb 14.4 oz (81.1 kg), SpO2 96 %.  General: Well developed, well nourished, male in no acute distress Head: Eyes PERRLA, No xanthomas.   Normocephalic and atraumatic, oropharynx without edema or exudate. Dentition: good Lungs: clear bilaterally Heart: HRRR S1 S2, no rub/gallop, no murmur. pulses are 2+ all 4 extrem.   Neck: No carotid bruits. No lymphadenopathy.  JVD not elevated Abdomen: Bowel sounds present, abdomen soft and non-tender without masses or hernias noted. Msk:  No spine or cva tenderness. No weakness, no joint deformities or effusions. Extremities: No clubbing or cyanosis. No edema.  Neuro: Alert and oriented X 3. No focal deficits noted. Psych:  Good affect, responds appropriately Skin: No rashes or lesions noted.  Labs:   Lab Results  Component Value Date   WBC 9.5 11/25/2015   HGB 16.2 11/25/2015   HCT 46.7 11/25/2015   MCV 85.5 11/25/2015   PLT 186 11/25/2015    Recent Labs Lab 11/24/15 1008 11/25/15 0254  NA 133* 136  K 5.3* 3.6  CL 99* 103  CO2 23 26  BUN 13 11  CREATININE 1.48* 1.32*  CALCIUM 9.2 8.6*  PROT 6.6  --   BILITOT 0.7  --   ALKPHOS 90  --   ALT 16*  --   AST 18  --   GLUCOSE 347* 188*  ALBUMIN 4.1  --     Recent Labs  11/24/15 1542 11/24/15 2145 11/25/15 0254  TROPONINI 0.05* 0.05* 0.07*    Recent Labs  11/24/15 1014  TROPIPOC 0.01   Lab Results  Component Value Date   CHOL 161 11/25/2015   HDL 32 (L) 11/25/2015   LDLCALC 67 11/25/2015   TRIG 309 (H) 11/25/2015   Lab Results  Component Value Date   HGBA1C 9.0 (H) 11/24/2015   Lipase  Date/Time Value Ref Range Status  11/24/2015 10:08 AM 27 11 - 51 U/L Final   Echo: ordered  ECG:  10/12 SR, non-specific changes, not clearly ischemic  Cath: n/a  Radiology:  Dg Chest 2  View Result Date: 11/24/2015 CLINICAL DATA:  Chest pain and nausea beginning this morning. EXAM: CHEST  2 VIEW COMPARISON:  Chest CT scan 08/20/2008. FINDINGS: The lungs are clear. No pneumothorax or pleural effusion. Heart size is normal. No focal bony abnormality. IMPRESSION: No acute disease. Electronically Signed   By: Drusilla Kanner  M.D.   On: 11/24/2015 11:28    ASSESSMENT AND PLAN:   The patient was seen today by Dr Mayford Knife, the patient evaluated and the data reviewed.  Principal Problem: 1.  Chest pain - minimal elevation in troponin - ECG not acute, no old to compare - CRFs are tob use, DM (w/ HgbA1c 9.0),  Distant FH - LDL 67, HDL 32 on profile today. - probably needs ischemic eval, MV or cath per MD.  Otherwise, per IM Active Problems:   TOBACCO ABUSE   Diabetes mellitus, insulin dependent (IDDM), uncontrolled (HCC)   GERD (gastroesophageal reflux disease)   Signed: Theodore Demark, PA-C 11/25/2015 11:16 AM Beeper [phone]  Co-Sign MD

## 2015-11-25 NOTE — Interval H&P Note (Signed)
History and Physical Interval Note:  11/25/2015 5:22 PM  Christopher Cooper  has presented today for surgery, with the diagnosis of cp  The various methods of treatment have been discussed with the patient and family. After consideration of risks, benefits and other options for treatment, the patient has consented to  Procedure(s): Left Heart Cath and Coronary Angiography (N/A) as a surgical intervention .  The patient's history has been reviewed, patient examined, no change in status, stable for surgery.  I have reviewed the patient's chart and labs.  Questions were answered to the patient's satisfaction.   Cath Lab Visit (complete for each Cath Lab visit)  Clinical Evaluation Leading to the Procedure:   ACS: Yes.    Non-ACS:    Anginal Classification: CCS IV  Anti-ischemic medical therapy: No Therapy  Non-Invasive Test Results: No non-invasive testing performed  Prior CABG: No previous CABG        Theron Aristaeter [facility] CenterJordanMD,FACC 11/25/2015 5:22 PM

## 2015-11-25 NOTE — Progress Notes (Signed)
PROGRESS NOTE    Christopher Cooper  JXB:[id number] DOB: 07/27/1960 DOA: 11/24/2015 PCP: Pearson GrippeJames Kim, MD   Brief Narrative:  Christopher Cooper is a 55 y.o. male with medical history significant for diabetes on insulin, ongoing tobacco abuse, severe GERD, history of exposure to asbestos at work remotely. Patient reported yesterday morning he got out of bed quickly to go to the bathroom and began having a bowel movement when he began having discomfort in the upper chest just below the neck that did not radiate. He reported "not feeling right" and decided to check his fitness watch which documented a heart rate of 42. At this point he became diaphoretic. He got off the toilet and called EMS who instructed him to chew 4 baby aspirin at which point he became nauseous and then developed emesis. After vomiting he became somewhat short of breath. He stated the chest pain lasted about 15-20 minutes. He did not have chest pain with either exertional or rest nor dyspnea with exertion or rest prior to today's episode. EMS was called to the home and patient was transported to the ER where he was chest pain-free and EKG was unremarkable. Cardiology was consulted for his CP and recommended Catheterization where it was shown to have an obstruction in the distal RCA in which he underwent stenting.   Assessment & Plan:   Principal Problem:   Chest pain Active Problems:   TOBACCO ABUSE   Diabetes mellitus, insulin dependent (IDDM), uncontrolled (HCC)   GERD (gastroesophageal reflux disease)   NSTEMI (non-ST elevated myocardial infarction) (HCC)  CAD s/p Distal RCA DES   -Patient presented with one episode of upper chest pain that began after passing a bowel movement with concurrent drop in heart rate to 42 bpm suspicious for vagal activity secondary to passage of bowel movement (in addition to chest pain patient also became diaphoretic then experienced nausea with vomiting) -Cardiology Consulted and Recommended  Cathetherization; Last Outpatient Treadmill Stress Test was Negative. -Heart score equals 3 -Cardiac Risk factors include diabetes, Tobacco abuse, Male sex -Troponins Mildly Elevated at 0.05 -> 0.05 -> 0.07 -Echocardiogram Pending -C/w Dual Antiplateltet Therapy with ASA 81 mg and with Ticagrelor 90 mg po BID -Atorvastatin 80 mg started by Cardiology -Continue to Follow Cardiac Recc's  Diabetes mellitus, insulin dependent (IDDM), uncontrolled  -Patient reports improving hemoglobin A1c's but not well controlled in the outpatient setting -Glucose at presentation 327; Repeat on BMP was 188 -C/w Sensitive Novolog SSI AC and HS and with Insulin Glargine 25 units sq Daily -Check HbA1c was 9.0  Hypertriglyceridemia -Lipid Panel showed Cholesterol of 161, TG of 309, HDL of 32, and LDL of 67 -C/w Atorvastatin 80 mg po qHS -Will need to Modify Diet and Exercise.   GERD (gastroesophageal reflux disease) -C/w Famotidine 20 mg po BID  TOBACCO ABUSE -Counseled regarding cessation -Nicotine patch 21 mg   DVT prophylaxis: SCD's Code Status: FULL Family Communication: Discussed Plan of Care with patient's wife at Bedside Disposition Plan: Likely D/C Home tomorrow.    Consultants:   Cardiology   Procedures: Left Heart Cath and Coronary Angiography  Prox LAD to Mid LAD lesion, 30 %stenosed.  Ost 1st Mrg to 1st Mrg lesion, 30 %stenosed.  The left ventricular systolic function is normal.  LV end diastolic pressure is normal.  The left ventricular ejection fraction is 55-65% by visual estimate.  A STENT PROMUS PREM MR 3.0X16 drug eluting stent was successfully placed.  Mid RCA to Dist RCA lesion, 90 %stenosed.  Post intervention, there is a 0% residual stenosis.   1. Single vessel obstructive CAD with ulcerated plaque/stenosis in the distal RCA. 2. Normal LV function 3. Normal LV EDP. 4. Successful stenting of the distal RCA with a DES.    Antimicrobials:  None  Subjective: Seen and examined at bedside this AM prior to Cardiology evaluating the patient and he stated he felt good. Discussed the results of his lipid panel and his Troponins and stated further disposition would be made pending Cardiology. No Active CP/SOB/N or Vomiting. Wanted to know when he could eat and patient was told he was NPO for possible procedure. No other complaints or concerns.  Objective: Vitals:   11/25/15 1750 11/25/15 1755 11/25/15 1800 11/25/15 1915  BP: (!) 147/89 127/81 124/83 137/82  Pulse: 72 78 76 73  Resp: 10 12 15    Temp:    98 F (36.7 C)  TempSrc:    Oral  SpO2: 99% 95% 97% 97%  Weight:      Height:        Intake/Output Summary (Last 24 hours) at 11/25/15 1934 Last data filed at 11/25/15 1200  Gross per 24 hour  Intake          1406.25 ml  Output                0 ml  Net          1406.25 ml   Filed Weights   11/24/15 1004 11/24/15 1703 11/25/15 0700  Weight: 81.6 kg (180 lb) 81.5 kg (179 lb 9.6 oz) 81.1 kg (178 lb 14.4 oz)    Examination: Physical Exam:  Constitutional: WN/WD, NAD and appears calm and comfortable sitting in chair bedside. Eyes:  Lids and conjunctivae normal, sclerae anicteric  ENMT: External Ears, Nose appear normal. Grossly normal hearing. Mucous membranes are moist. Normal dentition.  Neck: Appears normal, supple, no cervical masses, normal ROM, no appreciable thyromegaly, no JVD Respiratory: Clear to auscultation bilaterally, no wheezing, rales, rhonchi or crackles. Normal respiratory effort and patient is not tachypenic. No accessory muscle use.  Cardiovascular: RRR, no murmurs / rubs / gallops. S1 and S2 auscultated. No extremity edema. 2+ pedal pulses. No carotid bruits.  Abdomen: Soft, non-tender, non-distended. No masses palpated. No appreciable hepatosplenomegaly. Bowel sounds positive x4.  GU: Deferred. Musculoskeletal: No clubbing / cyanosis of digits/nails. No joint deformity upper and lower extremities. Good  ROM, no contractures. Normal strength and muscle tone.  Skin: No rashes, lesions, ulcers. No induration; Warm and dry.  Neurologic: CN 2-12 grossly intact with no focal deficits. Sensation intact in all 4 Extremities,  Strength 5/5 in all 4. Romberg sign cerebellar reflexes not assessed.  Psychiatric: Normal judgment and insight. Alert and oriented x 3. Normal mood and appropriate affect.   Data Reviewed: I have personally reviewed following labs and imaging studies  CBC:  Recent Labs Lab 11/24/15 1008 11/25/15 0254  WBC 10.0 9.5  HGB 17.3* 16.2  HCT 49.0 46.7  MCV 85.1 85.5  PLT 178 186   Basic Metabolic Panel:  Recent Labs Lab 11/24/15 1008 11/25/15 0254  NA 133* 136  K 5.3* 3.6  CL 99* 103  CO2 23 26  GLUCOSE 347* 188*  BUN 13 11  CREATININE 1.48* 1.32*  CALCIUM 9.2 8.6*   GFR: Estimated Creatinine Clearance: 64.7 mL/min (by C-G formula based on SCr of 1.32 mg/dL (H)). Liver Function Tests:  Recent Labs Lab 11/24/15 1008  AST 18  ALT 16*  ALKPHOS 90  BILITOT 0.7  PROT 6.6  ALBUMIN 4.1    Recent Labs Lab 11/24/15 1008  LIPASE 27   No results for input(s): AMMONIA in the last 168 hours. Coagulation Profile:  Recent Labs Lab 11/25/15 1223  INR 1.04   Cardiac Enzymes:  Recent Labs Lab 11/24/15 1542 11/24/15 2145 11/25/15 0254  TROPONINI 0.05* 0.05* 0.07*   BNP (last 3 results) No results for input(s): PROBNP in the last 8760 hours. HbA1C:  Recent Labs  11/24/15 1519  HGBA1C 9.0*   CBG:  Recent Labs Lab 11/24/15 1739 11/24/15 2150 11/25/15 0746 11/25/15 1129 11/25/15 1645  GLUCAP 264* 240* 171* 148* 111*   Lipid Profile:  Recent Labs  11/25/15 0254  CHOL 161  HDL 32*  LDLCALC 67  TRIG 454*  CHOLHDL 5.0   Thyroid Function Tests: No results for input(s): TSH, T4TOTAL, FREET4, T3FREE, THYROIDAB in the last 72 hours. Anemia Panel: No results for input(s): VITAMINB12, FOLATE, FERRITIN, TIBC, IRON, RETICCTPCT in the last  72 hours. Sepsis Labs: No results for input(s): PROCALCITON, LATICACIDVEN in the last 168 hours.  No results found for this or any previous visit (from the past 240 hour(s)).   Radiology Studies: Dg Chest 2 View  Result Date: 11/24/2015 CLINICAL DATA:  Chest pain and nausea beginning this morning. EXAM: CHEST  2 VIEW COMPARISON:  Chest CT scan 08/20/2008. FINDINGS: The lungs are clear. No pneumothorax or pleural effusion. Heart size is normal. No focal bony abnormality. IMPRESSION: No acute disease. Electronically Signed   By: Drusilla Kanner M.D.   On: 11/24/2015 11:28    Scheduled Meds: . aspirin EC  81 mg Oral Daily  . [START ON 11/26/2015] atorvastatin  80 mg Oral q1800  . famotidine  20 mg Oral BID  . insulin aspart  0-5 Units Subcutaneous QHS  . insulin aspart  0-9 Units Subcutaneous TID WC  . insulin glargine  25 Units Subcutaneous Daily  . nicotine  21 mg Transdermal Daily  . sodium chloride flush  3 mL Intravenous Q12H  . ticagrelor  90 mg Oral BID   Continuous Infusions: . sodium chloride 3 mL/kg/hr (11/25/15 1900)     LOS: 0 days   Merlene Laughter, DO [facility] Pager [phone]  If 7PM-7AM, please contact night-coverage [email or web address] Password [facility] 11/25/2015, 7:34 PM

## 2015-11-26 ENCOUNTER — Inpatient Hospital Stay (HOSPITAL_COMMUNITY): Payer: 59

## 2015-11-26 ENCOUNTER — Encounter (HOSPITAL_COMMUNITY): Payer: Self-pay | Admitting: Cardiology

## 2015-11-26 DIAGNOSIS — R079 Chest pain, unspecified: Secondary | ICD-10-CM

## 2015-11-26 DIAGNOSIS — I2510 Atherosclerotic heart disease of native coronary artery without angina pectoris: Secondary | ICD-10-CM | POA: Diagnosis present

## 2015-11-26 DIAGNOSIS — Z959 Presence of cardiac and vascular implant and graft, unspecified: Secondary | ICD-10-CM

## 2015-11-26 DIAGNOSIS — Z95820 Peripheral vascular angioplasty status with implants and grafts: Secondary | ICD-10-CM

## 2015-11-26 HISTORY — DX: Peripheral vascular angioplasty status with implants and grafts: Z95.820

## 2015-11-26 LAB — BASIC METABOLIC PANEL
Anion gap: 8 (ref 5–15)
BUN: 12 mg/dL (ref 6–20)
CALCIUM: 8.5 mg/dL — AB (ref 8.9–10.3)
CO2: 24 mmol/L (ref 22–32)
CREATININE: 1.23 mg/dL (ref 0.61–1.24)
Chloride: 107 mmol/L (ref 101–111)
GFR calc Af Amer: >60 mL/min (ref >60)
Glucose, Bld: 108 mg/dL — ABNORMAL HIGH (ref 65–99)
Potassium: 3.6 mmol/L (ref 3.5–5.1)
SODIUM: 139 mmol/L (ref 135–145)

## 2015-11-26 LAB — CBC
HCT: 44.6 % (ref 39.0–52.0)
Hemoglobin: 15.4 g/dL (ref 13.0–17.0)
MCH: 29.4 pg (ref 26.0–34.0)
MCHC: 34.5 g/dL (ref 30.0–36.0)
MCV: 85.3 fL (ref 78.0–100.0)
PLATELETS: 184 10*3/uL (ref 150–400)
RBC: 5.23 MIL/uL (ref 4.22–5.81)
RDW: 13.6 % (ref 11.5–15.5)
WBC: 8.7 10*3/uL (ref 4.0–10.5)

## 2015-11-26 LAB — TROPONIN I: Troponin I: <0.03 ng/mL (ref <0.03)

## 2015-11-26 LAB — ECHOCARDIOGRAM COMPLETE
HEIGHTINCHES: 66 in
Weight: 2991.2 oz

## 2015-11-26 LAB — PHOSPHORUS: Phosphorus: 3.6 mg/dL (ref 2.5–4.6)

## 2015-11-26 LAB — MAGNESIUM: Magnesium: 2.1 mg/dL (ref 1.7–2.4)

## 2015-11-26 LAB — GLUCOSE, CAPILLARY: Glucose-Capillary: 131 mg/dL — ABNORMAL HIGH (ref 65–99)

## 2015-11-26 MED ORDER — NITROGLYCERIN 0.4 MG SL SUBL: 0.4000 mg | SUBLINGUAL_TABLET | SUBLINGUAL | 12 refills | Status: AC | PRN

## 2015-11-26 MED ORDER — ATORVASTATIN CALCIUM 80 MG PO TABS: 80.0000 mg | ORAL_TABLET | Freq: Every day | ORAL | 0 refills | Status: DC

## 2015-11-26 MED ORDER — NITROGLYCERIN 0.4 MG SL SUBL: 0.4000 mg | SUBLINGUAL_TABLET | SUBLINGUAL | Status: DC | PRN

## 2015-11-26 MED ORDER — TICAGRELOR 90 MG PO TABS: 90.0000 mg | ORAL_TABLET | Freq: Two times a day (BID) | ORAL | 12 refills | Status: DC

## 2015-11-26 MED ORDER — INSULIN LISPRO 100 UNIT/ML ~~LOC~~ SOLN: 20.0000 [IU] | Freq: Two times a day (BID) | SUBCUTANEOUS | 2 refills | Status: DC

## 2015-11-26 MED ORDER — INFLUENZA VAC SPLIT QUAD 0.5 ML IM SUSY
0.5000 mL | PREFILLED_SYRINGE | INTRAMUSCULAR | Status: AC
  Administered 2015-11-26: 11:00:00 0.5 mL via INTRAMUSCULAR

## 2015-11-26 NOTE — Discharge Instructions (Signed)
Heart Healthy Diabetic Diet.  Call [facility] at [phone] if any bleeding, swelling or drainage at cath site.  May shower, no tub baths for 48 hours for groin sticks. No lifting over 5 pounds for 3 days.  No Driving for 3 days.    Take 1 NTG, under your tongue, while sitting.  If no relief of pain may repeat NTG, one tab every 5 minutes up to 3 tablets total over 15 minutes.  If no relief CALL 911.  If you have dizziness/lightheadness  while taking NTG, stop taking and call 911.        Do Not stop Asprin or Plavix, stopping could cause a heart attack.  Call the office if you have any trouble obtaining.  Our office will call you Monday or Tuesday for follow up appointment in next week to 10 days.

## 2015-11-26 NOTE — Progress Notes (Signed)
Echocardiogram 2D Echocardiogram has been performed.  Marisue Humblelexis N Lexton Hidalgo 11/26/2015, 8:55 AM

## 2015-11-26 NOTE — Progress Notes (Signed)
TR BAND REMOVAL  LOCATION:    right radial  DEFLATED PER PROTOCOL:    Yes.    TIME BAND OFF / DRESSING APPLIED:    2300   SITE UPON ARRIVAL:    Level 0  SITE AFTER BAND REMOVAL:    Level 0  CIRCULATION SENSATION AND MOVEMENT:    Within Normal Limits   Yes.    COMMENTS:   Pt tolerated well o2 sats on right thumb 97% RA

## 2015-11-26 NOTE — Discharge Summary (Signed)
Physician Discharge Summary  [name]:[id number] DOB: [birth year] DOA: 11/24/2015  PCP: [name], MD  Admit date: 11/24/2015 Discharge date: 11/26/2015  Admitted From: HOME Disposition:  HOME  Recommendations for Outpatient Follow-up:  1. Follow up with PCP Dr. Selena Batten in 1-2 weeks 2. Follow up with Cardiology as an Outpatient 3. Please obtain BMP/CBC in one week 4. Please follow up on the following pending results: ECHOCARDIOGRAM not reviewed with Patient as an Inpatient.   Home Health: No but he is going to Cardiac Rehab hopefully at [facility] work permitting.  Equipment/Devices: None  Discharge Condition: Stable and Improved.  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 Patient reported yesterday morning he got out of bed quickly to go to the bathroom and began having a bowel movement when he began having discomfort in the upper chest just below the neck that did not radiate. He reported "not feeling right" and decided to check his fitness watch which documented a heart rate of 42. At this point he became diaphoretic. He got off the toilet and called EMS who instructed him to chew 4baby aspirin at which point he became nauseous and then developed emesis. After vomiting he became somewhat short of breath. He stated the chest pain lasted about 15-20 minutes. He did not have chest pain with either exertional or rest nor dyspnea with exertion or rest prior to today's episode. EMS was called to the home and patient was transported to the ER where he was chest pain-free and EKG was unremarkable. Cardiology was consulted for his CP and recommended Catheterization where it was shown to have an obstruction in the distal RCA in which he underwent stenting. He was  placed on Dual Antiplatelet Therapy and was doing well the day after. He was deemed medically stable for Discharge and he will follow up with his PCP Dr. Selena Batten and with Cardiology [facility] as an outpatient.   Discharge Diagnoses:  Principal Problem:   Chest pain Active Problems:   TOBACCO ABUSE   Diabetes mellitus, insulin dependent (IDDM), uncontrolled (HCC)   GERD (gastroesophageal reflux disease)   NSTEMI (non-ST elevated myocardial infarction) (HCC)   CAD in native artery   S/P angioplasty with stent, 11/25/15 to mRCA to dRCA promus premier DES  CAD s/p Distal RCA DES   -Patient presented with one episode of upper chest pain that began after passing a bowel movement with concurrent drop in heart rate to 42 bpmsuspicious for vagal activity secondary to passage of bowel movement (in addition tochest pain patient also became diaphoretic then experiencednausea with vomiting) -Cardiology Consulted and Recommended Cathetherization; Last Outpatient Treadmill Stress Test was Negative. -Heart score equals 3 -Cardiac Risk factors included Diabetes, Tobacco abuse, Male sex -Cath showed:    1. Single vessel obstructive CAD with ulcerated plaque/stenosis in the distal RCA.    2. Normal LV function    3. Normal LV EDP.    4. Successful stenting of the distal RCA with a DES.  -Troponins Mildly Elevated at 0.05 -> 0.05 -> 0.07 -> Troponin Today was <0.03 -Echocardiogram Normal Study -C/w Dual Antiplateltet Therapy with ASA 81 mg and with Ticagrelor 90 mg po BID for at least a year -Cardiac Rehab -Atorvastatin 80 mg started by Cardiology -Follow up with Cardiology as an Outpatient and with PCP  Diabetes mellitus, insulin dependent (IDDM), uncontrolled  -Patient reports improving hemoglobin A1c's but not well controlled in the outpatient setting -Glucose at presentation 327; Repeat on BMP was 188 -C/w Home Insulin Regimen; Refilled Insulin -Checked HbA1c was 9.0 -Dietician ans an  Outpatient  Hypertriglyceridemia -Lipid Panel showed Cholesterol of 161, TG of 309, HDL of 32, and LDL of 67 -C/w Atorvastatin 80 mg po qHS -Will need to Modify Diet and Exercise.   GERD (Gastroesophageal reflux disease) -C/w Famotidine 20 mg po BID   TOBACCO ABUSE -Counseled regarding cessation -Nicotine patch 21 mg declined by patient on Discharge  Discharge Instructions  Discharge Instructions    AMB Referral to Cardiac Rehabilitation - Phase II    Complete by:  As directed    Diagnosis:  NSTEMI   Amb Referral to Cardiac Rehabilitation    Complete by:  As directed    Diagnosis:  Coronary Stents   Call MD for:  difficulty breathing, headache or visual disturbances    Complete by:  As directed    Call MD for:  persistant dizziness or light-headedness    Complete by:  As directed    Diet - low sodium heart healthy    Complete by:  As directed    Discharge instructions    Complete by:  As directed    Follow up with PCP Dr. Selena Batten and with Cardiology in 1 week. Take all medications as prescribed. If symptoms worsen or change please come to the ER for evaluation.   Increase activity slowly    Complete by:  As directed        Medication List    STOP taking these medications   metFORMIN 500 MG tablet Commonly known as:  GLUCOPHAGE   VIAGRA 100 MG tablet Generic drug:  sildenafil     TAKE these medications   aspirin EC 81 MG tablet Take 81 mg by mouth daily.   atorvastatin 80 MG tablet Commonly known as:  LIPITOR Take 1 tablet (80 mg total) by mouth daily at 6 PM.   insulin lispro 100 UNIT/ML injection Commonly known as:  HUMALOG Inject 0.2 mLs (20 Units total) into the skin 2 (two) times daily.   nitroGLYCERIN 0.4 MG SL tablet Commonly known as:  NITROSTAT Place 1 tablet (0.4 mg total) under the tongue every 5 (five) minutes as needed for chest pain.   ranitidine 150 MG capsule Commonly known as:  ZANTAC Take 150 mg by mouth daily as needed for heartburn.    SOLIQUA 100-33 UNT-MCG/ML Sopn Generic drug:  Insulin Glargine-Lixisenatide Inject 25 Units into the skin daily.   ticagrelor 90 MG Tabs tablet Commonly known as:  BRILINTA Take 1 tablet (90 mg total) by mouth 2 (two) times daily.      Follow-up Information    Peter Swaziland, MD .   Specialty:  Cardiology Why:  the office will call with follow up appointment Contact information: [address] [place] [address] [phone]        [name], MD Follow up in 1 week(s).   Specialty:  Internal Medicine Why:  Patient to Call Contact information: [address] [place] [address] [place] [address] [phone]          No Known Allergies  Consultations:  Cardiology  Procedures/Studies: Dg Chest 2 View  Result Date: 11/24/2015 CLINICAL DATA:  Chest pain and nausea beginning this morning. EXAM: CHEST  2 VIEW COMPARISON:  Chest CT scan 08/20/2008. FINDINGS: The lungs are clear. No pneumothorax  or pleural effusion. Heart size is normal. No focal bony abnormality. IMPRESSION: No acute disease. Electronically Signed   By: Drusilla Kanner M.D.   On: 11/24/2015 11:28   CARDIAC CATHETERIZATION  Prox LAD to Mid LAD lesion, 30 %stenosed.  Ost 1st Mrg to 1st Mrg lesion, 30 %stenosed.  The left ventricular systolic function is normal.  LV end diastolic pressure is normal.  The left ventricular ejection fraction is 55-65% by visual estimate.  A STENT PROMUS PREM MR 3.0X16 drug eluting stent was successfully placed.  Mid RCA to Dist RCA lesion, 90 %stenosed.  Post intervention, there is a 0% residual stenosis.   1. Single vessel obstructive CAD with ulcerated plaque/stenosis in the distal RCA. 2. Normal LV function 3. Normal LV EDP. 4. Successful stenting of the distal RCA with a DES.  ECHOCARDIOGRAM Study Conclusions  - Left ventricle: The cavity size was normal. Wall thickness was   normal. Systolic function was normal. The estimated ejection    fraction was in the range of 55% to 60%. Wall motion was normal;   there were no regional wall motion abnormalities. Left   ventricular diastolic function parameters were normal. - Aortic valve: There was no stenosis. - Mitral valve: There was no significant regurgitation. - Right ventricle: The cavity size was normal. Systolic function   was normal. - Pulmonary arteries: No complete TR doppler jet so unable to   estimate PA systolic pressure. - Inferior vena cava: The vessel was normal in size. The   respirophasic diameter changes were in the normal range (>= 50%),   consistent with normal central venous pressure.  Impressions:  - Normal study.   Subjective: Patient was seen and examined at bedside and was doing well s/p Stenting. Had no active complaints or concerns. Denied CP/SOB/N/V or lightheadedness or dizziness. 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   Discharge Exam: Vitals:   11/26/15 0800 11/26/15 0807  BP: (!) 149/83 (!) 149/83  Pulse:  83  Resp: 16 16  Temp:  98.1 F (36.7 C)   Vitals:   11/25/15 2100 11/26/15 0405 11/26/15 0800 11/26/15 0807  BP: 136/88 (!) 153/89 (!) 149/83 (!) 149/83  Pulse: 87 73  83  Resp: 18 13 16 16   Temp:  97.1 F (36.2 C)  98.1 F (36.7 C)  TempSrc:  Axillary  Oral  SpO2: 95% 96%  97%  Weight:  84.8 kg (186 lb 15.2 oz)    Height:       General: Pt is alert, awake, not in acute distress Cardiovascular: RRR, S1/S2 +, no rubs, no gallops Respiratory: CTA bilaterally, no wheezing, no rhonchi Abdominal: Soft, NT, ND, bowel sounds + Extremities: no edema, no cyanosis  The results of significant diagnostics from this hospitalization (including imaging, microbiology, ancillary and laboratory) are listed below for reference.    Microbiology: No results found for this or any previous visit (from the past 240 hour(s)).   Labs: BNP (last 3 results) No results for input(s): BNP in the last 8760  hours. Basic Metabolic Panel:  Recent Labs Lab 11/24/15 1008 11/25/15 0254 11/26/15 0317  NA 133* 136 139  K 5.3* 3.6 3.6  CL 99* 103 107  CO2 23 26 24   GLUCOSE 347* 188* 108*  BUN 13 11 12   CREATININE 1.48* 1.32* 1.23  CALCIUM 9.2 8.6* 8.5*  MG  --   --  2.1  PHOS  --   --  3.6   Liver Function Tests:  Recent Labs Lab 11/24/15 1008  AST 18  ALT 16*  ALKPHOS 90  BILITOT 0.7  PROT 6.6  ALBUMIN 4.1    Recent Labs Lab 11/24/15 1008  LIPASE 27   No results for input(s): AMMONIA in the last 168 hours. CBC:  Recent Labs Lab 11/24/15 1008 11/25/15 0254 11/26/15 0317  WBC 10.0 9.5 8.7  HGB 17.3* 16.2 15.4  HCT 49.0 46.7 44.6  MCV 85.1 85.5 85.3  PLT 178 186 184   Cardiac Enzymes:  Recent Labs Lab 11/24/15 1542 11/24/15 2145 11/25/15 0254 11/26/15 0818  TROPONINI 0.05* 0.05* 0.07* <0.03   BNP: Invalid input(s): POCBNP CBG:  Recent Labs Lab 11/25/15 0746 11/25/15 1129 11/25/15 1645 11/25/15 2105 11/26/15 0610  GLUCAP 171* 148* 111* 289* 131*   D-Dimer No results for input(s): DDIMER in the last 72 hours. Hgb A1c  Recent Labs  11/24/15 1519  HGBA1C 9.0*   Lipid Profile  Recent Labs  11/25/15 0254  CHOL 161  HDL 32*  LDLCALC 67  TRIG 191309*  CHOLHDL 5.0   Thyroid function studies No results for input(s): TSH, T4TOTAL, T3FREE, THYROIDAB in the last 72 hours.  Invalid input(s): FREET3 Anemia work up No results for input(s): VITAMINB12, FOLATE, FERRITIN, TIBC, IRON, RETICCTPCT in the last 72 hours. Urinalysis No results found for: COLORURINE, APPEARANCEUR, LABSPEC, PHURINE, GLUCOSEU, HGBUR, BILIRUBINUR, KETONESUR, PROTEINUR, UROBILINOGEN, NITRITE, LEUKOCYTESUR Sepsis Labs Invalid input(s): PROCALCITONIN,  WBC,  LACTICIDVEN Microbiology No results found for this or any previous visit (from the past 240 hour(s)).  Time coordinating discharge: Over 30 minutes  SIGNED:  Merlene Laughtermair Latif Addysen Louth, DO [facility] 11/26/2015,  8:00 PM Pager [phone]  If 7PM-7AM, please contact night-coverage [email or web address] Password [email or web address]

## 2015-11-26 NOTE — Progress Notes (Signed)
CARDIAC REHAB PHASE I   PRE:  Rate/Rhythm: 76  BP:  Sitting: 126/81    SaO2: 97  MODE:  Ambulation: 1000 ft   POST:  Rate/Rhythm: 81  BP:  Sitting: 142/82     SaO2: 98  910-10:00  Patient ambulated independently at a slow and easy pace. Education completed with wife at side. Interested in Cardiac Rehab at [facility] if it will work with his work schedule.   Christopher ListerMolly M Safiyah Cisney, MS 11/26/2015 9:56 AM

## 2015-11-26 NOTE — Progress Notes (Signed)
Subjective: No chest pain, no SOB  Objective: Vital signs in last 24 hours: Temp:  [97.1 F (36.2 C)-98.1 F (36.7 C)] 98.1 F (36.7 C) (10/14 0807) Pulse Rate:  [68-87] 83 (10/14 0807) Resp:  [10-22] 16 (10/14 0807) BP: (118-153)/(74-89) 149/83 (10/14 0807) SpO2:  [95 %-100 %] 97 % (10/14 0807) Weight:  [186 lb 15.2 oz (84.8 kg)] 186 lb 15.2 oz (84.8 kg) (10/14 0405) Weight change: 6 lb 15.2 oz (3.152 kg) Last BM Date: 11/24/15 Intake/Output from previous day: 10/13 0701 - 10/14 0700 In: 240 [P.O.:240] Out: 450 [Urine:450] Intake/Output this shift: Total I/O In: 240 [P.O.:240] Out: -   PE: General:Pleasant affect, NAD Skin:Warm and dry, brisk capillary refill HEENT:normocephalic, sclera clear, mucus membranes moist Neck:supple, no JVD  Heart:S1S2 RRR without murmur, gallup, rub or click Lungs:clear without rales, rhonchi, or wheezes RUE:AVWU, non tender, + BS, do not palpate liver spleen or masses Ext:no lower ext edema, 2+ pedal pulses, 2+ radial pulses, rt radial cath site without problems Neuro:alert and oriented X 3, MAE, follows commands, + facial symmetry  tele:  SR to SB in 50s  Lab Results:  Recent Labs  11/25/15 0254 11/26/15 0317  WBC 9.5 8.7  HGB 16.2 15.4  HCT 46.7 44.6  PLT 186 184   BMET  Recent Labs  11/25/15 0254 11/26/15 0317  NA 136 139  K 3.6 3.6  CL 103 107  CO2 26 24  GLUCOSE 188* 108*  BUN 11 12  CREATININE 1.32* 1.23  CALCIUM 8.6* 8.5*    Recent Labs  11/24/15 2145 11/25/15 0254  TROPONINI 0.05* 0.07*    Lab Results  Component Value Date   CHOL 161 11/25/2015   HDL 32 (L) 11/25/2015   LDLCALC 67 11/25/2015   TRIG 309 (H) 11/25/2015   CHOLHDL 5.0 11/25/2015   Lab Results  Component Value Date   HGBA1C 9.0 (H) 11/24/2015     No results found for: TSH  Hepatic Function Panel  Recent Labs  11/24/15 1008  PROT 6.6  ALBUMIN 4.1  AST 18  ALT 16*  ALKPHOS 90  BILITOT 0.7    Recent Labs  11/25/15 0254  CHOL 161   No results for input(s): PROTIME in the last 72 hours.     Studies/Results: Dg Chest 2 View  Result Date: 11/24/2015 CLINICAL DATA:  Chest pain and nausea beginning this morning. EXAM: CHEST  2 VIEW COMPARISON:  Chest CT scan 08/20/2008. FINDINGS: The lungs are clear. No pneumothorax or pleural effusion. Heart size is normal. No focal bony abnormality. IMPRESSION: No acute disease. Electronically Signed   By: Drusilla Kanner M.D.   On: 11/24/2015 11:28   Procedures cardiac cath by Dr. Swaziland  Coronary Stent Intervention  Left Heart Cath and Coronary Angiography  Conclusion     Prox LAD to Mid LAD lesion, 30 %stenosed.  Ost 1st Mrg to 1st Mrg lesion, 30 %stenosed.  The left ventricular systolic function is normal.  LV end diastolic pressure is normal.  The left ventricular ejection fraction is 55-65% by visual estimate.  A STENT PROMUS PREM MR 3.0X16 drug eluting stent was successfully placed.  Mid RCA to Dist RCA lesion, 90 %stenosed.  Post intervention, there is a 0% residual stenosis.   1. Single vessel obstructive CAD with ulcerated plaque/stenosis in the distal RCA. 2. Normal LV function 3. Normal LV EDP. 4. Successful stenting of the distal RCA with a DES.  Plan: DAPT for one  year. Aggressive risk factor modification. Anticipate DC tomorrow.      Medications: I have reviewed the patient's current medications. Scheduled Meds: . aspirin EC  81 mg Oral Daily  . atorvastatin  80 mg Oral q1800  . famotidine  20 mg Oral BID  . insulin aspart  0-5 Units Subcutaneous QHS  . insulin aspart  0-9 Units Subcutaneous TID WC  . insulin glargine  25 Units Subcutaneous Daily  . nicotine  21 mg Transdermal Daily  . sodium chloride flush  3 mL Intravenous Q12H  . ticagrelor  90 mg Oral BID   Continuous Infusions:  PRN Meds:.sodium chloride, acetaminophen, nitroGLYCERIN, ondansetron (ZOFRAN) IV, sodium chloride  flush  Assessment/Plan: Principal Problem:   Chest pain Active Problems:   NSTEMI (non-ST elevated myocardial infarction) (HCC)   S/P angioplasty with stent, 11/25/15 to mRCA to dRCA promus premier DES   TOBACCO ABUSE   Diabetes mellitus, insulin dependent (IDDM), uncontrolled (HCC)   GERD (gastroesophageal reflux disease)   CAD in native artery  Pt should stop smoking DAPT for 1 year. Discussed with pt , needs statin, control of diabetes,  Needs 30 day free card for Brilinta.  As well as script for a year.  Will have pt seen in clinic in 5-7 days.   NTG prn.  Echo pending and troponin. Cardiac rehab to see.      LOS: 1 day   Time spent with pt. :  15  minutes. Nada BoozerLaura Ingold  Nurse Practitioner Certified Pager [phone] or after 5pm and on weekends call [phone] 11/26/2015, 8:22 AM  I have seen, examined the patient, and reviewed the above assessment and plan.  Changes to above are made where necessary.  On exam, RRR.  DC to home.  Routine post PCI follow-up.  Smoking cessation advised.  Co Sign: Hillis RangeJames Jull Harral, MD 11/26/2015 10:06 AM

## 2015-11-26 NOTE — Care Management Note (Signed)
Case Management Note  Patient Details  Name: BLANCA THORNTON MRN: [id number] Date of Birth: 1960-11-08  Subjective/Objective:                  discomfort in the upper chest Action/Plan: Discharge planing Expected Discharge Date:                  Expected Discharge Plan:  Home/Self Care  In-House Referral:     Discharge planning Services  CM Consult, Medication Assistance  Post Acute Care Choice:    Choice offered to:  Patient  DME Arranged:  N/A DME Agency:  NA  HH Arranged:  NA HH Agency:  NA  Status of Service:  Completed, signed off  If discussed at [place] of Stay Meetings, dates discussed:    Additional Comments: CM met with pt and gave him 30 day free trial card for Brilinta.  Pt verbalized understanding this card will pay for today's discharge prescription and insurance time for authorization for refills.  No other CM needs were communicated. Dellie Catholic, RN 11/26/2015, 10:51 AM

## 2015-11-28 ENCOUNTER — Encounter (HOSPITAL_COMMUNITY): Payer: Self-pay | Admitting: Cardiology

## 2015-11-28 LAB — GLUCOSE, CAPILLARY: Glucose-Capillary: 203 mg/dL — ABNORMAL HIGH (ref 65–99)

## 2015-12-05 ENCOUNTER — Encounter: Payer: Self-pay | Admitting: Physician Assistant

## 2015-12-05 ENCOUNTER — Ambulatory Visit (INDEPENDENT_AMBULATORY_CARE_PROVIDER_SITE_OTHER): Payer: 59 | Admitting: Physician Assistant

## 2015-12-05 VITALS — BP 130/88 | HR 81 | Ht 66.0 in | Wt 181.8 lb

## 2015-12-05 DIAGNOSIS — I2510 Atherosclerotic heart disease of native coronary artery without angina pectoris: Secondary | ICD-10-CM | POA: Diagnosis not present

## 2015-12-05 DIAGNOSIS — Z794 Long term (current) use of insulin: Secondary | ICD-10-CM

## 2015-12-05 DIAGNOSIS — Z720 Tobacco use: Secondary | ICD-10-CM

## 2015-12-05 DIAGNOSIS — Z79899 Other long term (current) drug therapy: Secondary | ICD-10-CM

## 2015-12-05 DIAGNOSIS — E781 Pure hyperglyceridemia: Secondary | ICD-10-CM | POA: Diagnosis not present

## 2015-12-05 DIAGNOSIS — E118 Type 2 diabetes mellitus with unspecified complications: Secondary | ICD-10-CM | POA: Diagnosis not present

## 2015-12-05 MED ORDER — METOPROLOL TARTRATE 25 MG PO TABS: 12.5000 mg | ORAL_TABLET | Freq: Two times a day (BID) | ORAL | 0 refills | Status: DC

## 2015-12-05 MED ORDER — METOPROLOL TARTRATE 25 MG PO TABS: 12.5000 mg | ORAL_TABLET | Freq: Two times a day (BID) | ORAL | 3 refills | Status: DC

## 2015-12-05 NOTE — Progress Notes (Signed)
Cardiology Office Note    Date:  12/05/2015   ID:  Christopher Cooper, DOB 02-22-1960, MRN [id number]  PCP:  [name], MD  Cardiologist:  Dr. Swaziland  Chief Complaint  Patient presents with  . Hospitalization Follow-up    seen for Dr. Swaziland    History of Present Illness:  Christopher Cooper is a 54 y.o. male with PMH of GERD, tobacco use, DM, remote asbestos exposure who recently diagnosed with CAD After presenting to the hospital on 11/24/2015 with chest pain and subsequently ruled in for NSTEMI. He underwent cardiac catheterization on 11/25/2015 showed 30% proximal LAD lesion, 30% OM1 lesion, 90% mid to distal RCA lesion treated with Promus Premier 3.0 x 16 mm DES, EF 55-65% during cath. Post cath, he was placed on aspirin and Brilinta. Lipid panel obtained on the same day showed total cholesterol 161, triglyceride 309, HDL 32, LDL 67. Echocardiogram was obtained on 11/26/2015, which showed ejection fraction 55-60%, no regional wall motion abnormality, no significant valvular issue.  He presents today for follow-up, he has quit smoking since recent MI. He denies any recent chest discomfort or shortness breath since left the hospital. He is planning to see his PCP next week. We have discussed his cath results, also emphasized on the need to be compliant with antiplatelet therapy. He has no heart failure symptom on exam. His EKG is normal. Recent echocardiogram is reassuring. 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 Note, he did have some acute kidney injury on initial arrival, I think it is reasonable to obtain a basic metabolic panel, he says he will obtain that at his PCPs office. Given the start of Lipitor, he will also need to have a fasting lipid panel and LFTs checked in 6 weeks. Otherwise I think he is doing quite well. He has not started on cardiac rehabilitation yet. He is also interested in dietary choices, I will refer him to a  dietitian.   Past Medical History:  Diagnosis Date  . Asbestos exposure   . CAD in native artery   . Diabetes (HCC)   . GERD (gastroesophageal reflux disease)   . Hyperlipidemia LDL goal <70   . Hypertension   . S/P angioplasty with stent, 11/25/15 to mRCA to dRCA promus premier DES 11/26/2015    Past Surgical History:  Procedure Laterality Date  . ANKLE ARTHROSCOPY Left    after injury  . CARDIAC CATHETERIZATION N/A 11/25/2015   Procedure: Left Heart Cath and Coronary Angiography;  Surgeon: Peter M Swaziland, MD;  [facility]: [facility] IN[facility] CV LAB;  Service: Cardiovascular;  Laterality: N/A;  . CARDIAC CATHETERIZATION N/A 11/25/2015   Procedure: Coronary Stent Intervention;  Surgeon: Peter M Swaziland, MD;  Location: [facility] INVASIVE CV LAB;  Service: Cardiovascular;  Laterality: N/A;  . CORONARY ANGIOPLASTY WITH STENT PLACEMENT      Current Medications: Outpatient Medications Prior to Visit  Medication Sig Dispense Refill  . aspirin EC 81 MG tablet Take 81 mg by mouth daily.    Marland Kitchen atorvastatin (LIPITOR) 80 MG tablet Take 1 tablet (80 mg total) by mouth daily at 6 PM. 30 tablet 0  . Insulin Glargine-Lixisenatide (SOLIQUA) 100-33 UNT-MCG/ML SOPN Inject 25 Units into the skin daily.    . insulin lispro (HUMALOG) 100 UNIT/ML injection Inject 0.2 mLs (20 Units total) into the skin 2 (two) times daily. 10 mL 2  . nitroGLYCERIN (NITROSTAT) 0.4 MG SL tablet Place  1 tablet (0.4 mg total) under the tongue every 5 (five) minutes as needed for chest pain. 30 tablet 12  . ranitidine (ZANTAC) 150 MG capsule Take 150 mg by mouth daily as needed for heartburn.    . ticagrelor (BRILINTA) 90 MG TABS tablet Take 1 tablet (90 mg total) by mouth 2 (two) times daily. 60 tablet 12   No facility-administered medications prior to visit.      Allergies:   Review of patient's allergies indicates no known allergies.   Social History   Social History  . Marital status: Married    Spouse name: N/A  . Number of  children: N/A  . Years of education: N/A   Occupational History  . Store Production designer, theatre/television/filmManager at [employer]    Social History Main Topics  . Smoking status: Former Smoker    Packs/day: 0.25    Types: Cigarettes  . Smokeless tobacco: Never Used  . Alcohol use No  . Drug use: No  . Sexual activity: Yes    Birth control/ protection: Other-see comments   Other Topics Concern  . None   Social History Narrative   [place].     Family History:  The patient's family history includes CAD in his other; Diabetes Mellitus II in his father and mother.   ROS:   Please see the history of present illness.    ROS All other systems reviewed and are negative.   PHYSICAL EXAM:   VS:  BP 130/88   Pulse 81   Ht 5\' 6"  (1.676 m)   Wt 181 lb 12.8 oz (82.5 kg)   BMI 29.34 kg/m    GEN: Well nourished, well developed, in no acute distress  HEENT: normal  Neck: no JVD, carotid bruits, or masses Cardiac: RRR; no murmurs, rubs, or gallops,no edema  Respiratory:  clear to auscultation bilaterally, normal work of breathing GI: soft, nontender, nondistended, + BS MS: no deformity or atrophy  Skin: warm and dry, no rash Neuro:  Alert and Oriented x 3, Strength and sensation are intact Psych: euthymic mood, full affect  Wt Readings from Last 3 Encounters:  12/05/15 181 lb 12.8 oz (82.5 kg)  11/26/15 186 lb 15.2 oz (84.8 kg)  09/02/08 185 lb (83.9 kg)      Studies/Labs Reviewed:   EKG:  EKG is ordered today.  The ekg ordered today demonstrates Normal sinus rhythm without significant ST-T wave changes.  Recent Labs: 11/24/2015: ALT 16 11/26/2015: BUN 12; Creatinine, Ser 1.23; Hemoglobin 15.4; Magnesium 2.1; Platelets 184; Potassium 3.6; Sodium 139   Lipid Panel    Component Value Date/Time   CHOL 161 11/25/2015 0254   TRIG 309 (H) 11/25/2015 0254   HDL 32 (L) 11/25/2015 0254   CHOLHDL 5.0 11/25/2015 0254   VLDL 62 (H) 11/25/2015 0254   LDLCALC 67 11/25/2015 0254     Additional studies/ records that were reviewed today include:   Echo 11/25/2015 Conclusion     Prox LAD to Mid LAD lesion, 30 %stenosed.  Ost 1st Mrg to 1st Mrg lesion, 30 %stenosed.  The left ventricular systolic function is normal.  LV end diastolic pressure is normal.  The left ventricular ejection fraction is 55-65% by visual estimate.  A STENT PROMUS PREM MR 3.0X16 drug eluting stent was successfully placed.  Mid RCA to Dist RCA lesion, 90 %stenosed.  Post intervention, there is a 0% residual stenosis.   1. Single vessel obstructive CAD with ulcerated plaque/stenosis in the  distal RCA. 2. Normal LV function 3. Normal LV EDP. 4. Successful stenting of the distal RCA with a DES.  Plan: DAPT for one year. Aggressive risk factor modification. Anticipate DC tomorrow.     Echo 11/26/2015 LV EF: 55% -   60%  - Left ventricle: The cavity size was normal. Wall thickness was   normal. Systolic function was normal. The estimated ejection   fraction was in the range of 55% to 60%. Wall motion was normal;   there were no regional wall motion abnormalities. Left   ventricular diastolic function parameters were normal. - Aortic valve: There was no stenosis. - Mitral valve: There was no significant regurgitation. - Right ventricle: The cavity size was normal. Systolic function   was normal. - Pulmonary arteries: No complete TR doppler jet so unable to   estimate PA systolic pressure. - Inferior vena cava: The vessel was normal in size. The   respirophasic diameter changes were in the normal range (>= 50%),   consistent with normal central venous pressure.  Impressions:  - Normal study.    ASSESSMENT:    1. Coronary artery disease involving native coronary artery of native heart without angina pectoris   2. Controlled type 2 diabetes mellitus with complication, with long-term current use of insulin (HCC)   3. Tobacco abuse   4. Hypertriglyceridemia   5.  Medication management      PLAN:  In order of problems listed above:  1. CAD: Denies any chest discomfort since left the hospital. Continue on aspirin and Brilinta. He has stopped smoking. Emphasis has been placed on risk factor control. Only minimal troponin elevation in the hospital, echocardiogram shows there's likely no significant damage to the heart. I have referred him to the diabetic dietitian. He has upcoming labs with his PCP, basic metabolic panel will need to be rechecked again as his creatinine was as high as 1.48 on initial arrival which trended down to 1.2 prior to discharge.  2. IDDM: On insulin at home, managed by PCP.  3. Hypertriglyceridemia: Currently on high-dose Lipitor. fasting lipid panel and LFT in 6 weeks   4. Tobacco abuse: He quit smoking since recent MI. I congratulated him on this.    Medication Adjustments/Labs and Tests Ordered: Current medicines are reviewed at length with the patient today.  Concerns regarding medicines are outlined above.  Medication changes, Labs and Tests ordered today are listed in the Patient Instructions below. Patient Instructions  Medication Instructions:  Your physician has recommended you make the following change in your medication:  1- START TAKING Metoprolol tartrate 12.5 mg (0.5 tablet) by mouth twice a day.   Labwork: Your physician recommends that you return for lab work in: 6 WEEKS TO CHECK your cholesterol and liver function. You will need to be fasting.   December 4TH, 2017.   Follow-Up: You have been referred to DIETICIAN FOR EVALUATION.  Your physician recommends that you schedule a follow-up appointment in: 2 MONTHS WITH DR Swaziland.    If you need a refill on your cardiac medications before your next appointment, please call your pharmacy.      Ramond Dial, [place]  12/05/2015 12:47 PM    [facility] [address] [place], [place], [place]  [address] Phone: [phone]; Fax: [phone]

## 2015-12-05 NOTE — Patient Instructions (Addendum)
Medication Instructions:  Your physician has recommended you make the following change in your medication:  1- START TAKING Metoprolol tartrate 12.5 mg (0.5 tablet) by mouth twice a day.   Labwork: Your physician recommends that you return for lab work in: 6 WEEKS TO CHECK your cholesterol and liver function. You will need to be fasting.   December 4TH, 2017.   Follow-Up: You have been referred to DIETICIAN FOR EVALUATION.  Your physician recommends that you schedule a follow-up appointment in: 2 MONTHS WITH DR SwazilandJORDAN.    If you need a refill on your cardiac medications before your next appointment, please call your pharmacy.

## 2016-01-11 ENCOUNTER — Encounter: Payer: 59 | Attending: Internal Medicine | Admitting: Skilled Nursing Facility1

## 2016-01-11 ENCOUNTER — Encounter: Payer: Self-pay | Admitting: Skilled Nursing Facility1

## 2016-01-11 DIAGNOSIS — Z6833 Body mass index (BMI) 33.0-33.9, adult: Secondary | ICD-10-CM | POA: Diagnosis not present

## 2016-01-11 DIAGNOSIS — E1069 Type 1 diabetes mellitus with other specified complication: Secondary | ICD-10-CM

## 2016-01-11 DIAGNOSIS — E118 Type 2 diabetes mellitus with unspecified complications: Secondary | ICD-10-CM | POA: Diagnosis not present

## 2016-01-11 DIAGNOSIS — E781 Pure hyperglyceridemia: Secondary | ICD-10-CM | POA: Insufficient documentation

## 2016-01-11 DIAGNOSIS — Z794 Long term (current) use of insulin: Secondary | ICD-10-CM | POA: Insufficient documentation

## 2016-01-11 DIAGNOSIS — E1065 Type 1 diabetes mellitus with hyperglycemia: Secondary | ICD-10-CM

## 2016-01-11 DIAGNOSIS — Z713 Dietary counseling and surveillance: Secondary | ICD-10-CM | POA: Insufficient documentation

## 2016-01-11 DIAGNOSIS — I2510 Atherosclerotic heart disease of native coronary artery without angina pectoris: Secondary | ICD-10-CM | POA: Insufficient documentation

## 2016-01-11 DIAGNOSIS — IMO0002 Reserved for concepts with insufficient information to code with codable children: Secondary | ICD-10-CM

## 2016-01-11 NOTE — Patient Instructions (Signed)
-  Always bring your meter with you everywhere you go -Always Properly dispose of your needles:  -Discard in a hard plastic/metal container with a lid (something the needle can't puncture)  -Write Do Not Recycle on the outside of the container  -Example: A laundry detergent bottle -Never use the same needle more than once -Eat about 3 carbohydrate choices for each meal and 1-2 for each snack -A meal: carbohydrates, protein, vegetable -A snack: A Fruit OR Vegetable AND Protein  -Try to be more active -Always pay attention to your body keeping watchful of possible low blood sugar (below 70) or high blood sugar (above 200)  -Check the label on your kind bar -Try greek yogurt

## 2016-01-11 NOTE — Progress Notes (Signed)
Diabetes Self-Management Education  Visit Type: First/Initial  Appt. Start Time: 9:00 Appt. End Time: 10:18  01/11/2016  Mr. Christopher SievingRiley Cooper, identified by name and date of birth, is a 55 y.o. male with a diagnosis of Diabetes: Type 2.   ASSESSMENT  Height 5\' 2"  (1.575 m), weight 183 lb (83 kg). Body mass index is 33.47 kg/m. Pt states he has made some dietary changes: jello instead of ice cream, pizza less often, etc. Pt states he struggles will lows frequently: 45. Pt states his wife is very worried for him.     Diabetes Self-Management Education - 01/11/16 0913      Visit Information   Visit Type First/Initial     Initial Visit   Diabetes Type Type 2   Are you currently following a meal plan? No   Are you taking your medications as prescribed? Yes   Date Diagnosed 2003     Health Coping   How would you rate your overall health? Good     Psychosocial Assessment   Patient Belief/Attitude about Diabetes Motivated to manage diabetes     Pre-Education Assessment   Patient understands the diabetes disease and treatment process. Needs Instruction   Patient understands incorporating nutritional management into lifestyle. Needs Instruction   Patient undertands incorporating physical activity into lifestyle. Needs Instruction   Patient understands using medications safely. Needs Instruction   Patient understands monitoring blood glucose, interpreting and using results Needs Instruction   Patient understands prevention, detection, and treatment of acute complications. Needs Instruction   Patient understands prevention, detection, and treatment of chronic complications. Needs Instruction   Patient understands how to develop strategies to address psychosocial issues. Needs Instruction   Patient understands how to develop strategies to promote health/change behavior. Needs Instruction     Complications   Last HgB A1C per patient/outside source 8 %   How often do you check your  blood sugar? 3-4 times/day   Have you had a dilated eye exam in the past 12 months? No   Have you had a dental exam in the past 12 months? No   Are you checking your feet? Yes   How many days per week are you checking your feet? 7     Dietary Intake   Breakfast fast food biscuit   Lunch pizza/fast food----none   Dinner spagetti----greens, spinach-----nuts   Beverage(s) coffee, unsweet tea, water      Exercise   Exercise Type Light (walking / raking leaves)   How many days per week to you exercise? 2   How many minutes per day do you exercise? 30   Total minutes per week of exercise 60     Patient Education   Previous Diabetes Education No   Disease state  Factors that contribute to the development of diabetes   Nutrition management  Role of diet in the treatment of diabetes and the relationship between the three main macronutrients and blood glucose level;Food label reading, portion sizes and measuring food.;Carbohydrate counting;Reviewed blood glucose goals for pre and post meals and how to evaluate the patients' food intake on their blood glucose level.;Meal timing in regards to the patients' current diabetes medication.;Information on hints to eating out and maintain blood glucose control.;Meal options for control of blood glucose level and chronic complications.   Physical activity and exercise  Role of exercise on diabetes management, blood pressure control and cardiac health.;Identified with patient nutritional and/or medication changes necessary with exercise.   Monitoring Yearly dilated eye exam;Daily foot exams;Identified  appropriate SMBG and/or A1C goals.   Acute complications Taught treatment of hypoglycemia - the 15 rule.   Chronic complications Lipid levels, blood glucose control and heart disease;Relationship between chronic complications and blood glucose control;Assessed and discussed foot care and prevention of foot problems   Psychosocial adjustment Worked with patient to  identify barriers to care and solutions;Helped patient identify a support system for diabetes management     Individualized Goals (developed by patient)   Nutrition Follow meal plan discussed;Adjust meds/carbs with exercise as discussed   Physical Activity Exercise 1-2 times per week;30 minutes per day     Post-Education Assessment   Patient understands the diabetes disease and treatment process. Demonstrates understanding / competency   Patient understands incorporating nutritional management into lifestyle. Demonstrates understanding / competency   Patient undertands incorporating physical activity into lifestyle. Demonstrates understanding / competency   Patient understands using medications safely. Demonstrates understanding / competency   Patient understands monitoring blood glucose, interpreting and using results Demonstrates understanding / competency   Patient understands prevention, detection, and treatment of acute complications. Demonstrates understanding / competency   Patient understands prevention, detection, and treatment of chronic complications. Demonstrates understanding / competency   Patient understands how to develop strategies to address psychosocial issues. Demonstrates understanding / competency   Patient understands how to develop strategies to promote health/change behavior. Demonstrates understanding / competency     Outcomes   Expected Outcomes Demonstrated interest in learning. Expect positive outcomes   Future DMSE PRN   Program Status Completed      Individualized Plan for Diabetes Self-Management Training:   Learning Objective:  Patient will have a greater understanding of diabetes self-management. Patient education plan is to attend individual and/or group sessions per assessed needs and concerns.   Plan:   Patient Instructions  -Always bring your meter with you everywhere you go -Always Properly dispose of your needles:  -Discard in a hard  plastic/metal container with a lid (something the needle can't puncture)  -Write Do Not Recycle on the outside of the container  -Example: A laundry detergent bottle -Never use the same needle more than once -Eat about 3 carbohydrate choices for each meal and 1-2 for each snack -A meal: carbohydrates, protein, vegetable -A snack: A Fruit OR Vegetable AND Protein  -Try to be more active -Always pay attention to your body keeping watchful of possible low blood sugar (below 70) or high blood sugar (above 200)  -Check the label on your kind bar -Try greek yogurt   Expected Outcomes:  Demonstrated interest in learning. Expect positive outcomes  Education material provided: Living Well with Diabetes, Meal plan card, My Plate, Snack sheet and Support group flyer  If problems or questions, patient to contact team via:  Phone  Future DSME appointment: PRN

## 2016-01-16 LAB — HEPATIC FUNCTION PANEL
ALT: 27 U/L (ref 9–46)
AST: 23 U/L (ref 10–35)
Albumin: 4.3 g/dL (ref 3.6–5.1)
Alkaline Phosphatase: 79 U/L (ref 40–115)
BILIRUBIN DIRECT: 0.2 mg/dL (ref <=0.2)
BILIRUBIN INDIRECT: 0.5 mg/dL (ref 0.2–1.2)
Total Bilirubin: 0.7 mg/dL (ref 0.2–1.2)
Total Protein: 6.8 g/dL (ref 6.1–8.1)

## 2016-01-16 LAB — LIPID PANEL
CHOL/HDL RATIO: 2.2 ratio (ref <5.0)
CHOLESTEROL: 74 mg/dL (ref <200)
HDL: 34 mg/dL — ABNORMAL LOW (ref >40)
LDL Cholesterol: 11 mg/dL (ref <100)
Triglycerides: 143 mg/dL (ref <150)
VLDL: 29 mg/dL (ref <30)

## 2016-01-18 ENCOUNTER — Telehealth: Payer: Self-pay | Admitting: Cardiology

## 2016-01-18 NOTE — Telephone Encounter (Signed)
Returning your call. °

## 2016-01-18 NOTE — Telephone Encounter (Signed)
See notes

## 2016-02-05 NOTE — Progress Notes (Signed)
Cardiology Office Note    Date:  02/09/2016   ID:  Christopher Cooper, DOB 10/31/1960, MRN [id number]  PCP:  Pearson GrippeJames Kim, MD  Cardiologist:  Dr. SwazilandJordan  Chief Complaint  Patient presents with  . Coronary Artery Disease    History of Present Illness:  Christopher Cooper is a 55 y.o. male with PMH of GERD, tobacco use, DM, remote asbestos exposure who was diagnosed with CAD After presenting to the hospital on 11/24/2015 with chest pain and subsequently ruled in for NSTEMI. He underwent cardiac catheterization on 11/25/2015 showed 30% proximal LAD lesion, 30% OM1 lesion, 90% mid to distal RCA lesion treated with Promus Premier 3.0 x 16 mm DES, EF 55-65% during cath. Post cath, he was placed on aspirin and Brilinta. Lipid panel obtained on the same day showed total cholesterol 161, triglyceride 309, HDL 32, LDL 67. Echocardiogram was obtained on 11/26/2015, which showed ejection fraction 55-60%, no regional wall motion abnormality, no significant valvular issue.  On follow up today he is feeling well. Slight SOB first thing in the morning. Otherwise no SOB, chest pain, palpitations. He is no longer smoking but does Vape some. Mostly walks at work at [employer].    Past Medical History:  Diagnosis Date  . Asbestos exposure   . CAD in native artery   . Diabetes (HCC)   . GERD (gastroesophageal reflux disease)   . Hyperlipidemia LDL goal <70   . Hypertension   . S/P angioplasty with stent, 11/25/15 to mRCA to dRCA promus premier DES 11/26/2015    Past Surgical History:  Procedure Laterality Date  . ANKLE ARTHROSCOPY Left    after injury  . CARDIAC CATHETERIZATION N/A 11/25/2015   Procedure: Left Heart Cath and Coronary Angiography;  Surgeon: Peter M SwazilandJordan, MD;  [facility];  Service: Cardiovascular;  Laterality: N/A;  . CARDIAC CATHETERIZATION N/A 11/25/2015   Procedure: Coronary Stent Intervention;  Surgeon: Peter M SwazilandJordan, MD;  [facility];  Service:  Cardiovascular;  Laterality: N/A;  . CORONARY ANGIOPLASTY WITH STENT PLACEMENT      Current Medications: Outpatient Medications Prior to Visit  Medication Sig Dispense Refill  . aspirin EC 81 MG tablet Take 81 mg by mouth daily.    Marland Kitchen. atorvastatin (LIPITOR) 80 MG tablet Take 1 tablet (80 mg total) by mouth daily at 6 PM. 30 tablet 0  . Insulin Glargine-Lixisenatide (SOLIQUA) 100-33 UNT-MCG/ML SOPN Inject 25 Units into the skin daily.    . insulin lispro (HUMALOG) 100 UNIT/ML injection Inject 0.2 mLs (20 Units total) into the skin 2 (two) times daily. 10 mL 2  . metFORMIN (GLUCOPHAGE) 500 MG tablet Take 2 tablets by mouth 2 (two) times daily.    . metoprolol tartrate (LOPRESSOR) 25 MG tablet Take 0.5 tablets (12.5 mg total) by mouth 2 (two) times daily. 45 tablet 3  . nitroGLYCERIN (NITROSTAT) 0.4 MG SL tablet Place 1 tablet (0.4 mg total) under the tongue every 5 (five) minutes as needed for chest pain. 30 tablet 12  . ranitidine (ZANTAC) 150 MG capsule Take 150 mg by mouth daily as needed for heartburn.    . ticagrelor (BRILINTA) 90 MG TABS tablet Take 1 tablet (90 mg total) by mouth 2 (two) times daily. 60 tablet 12  . metoprolol tartrate (LOPRESSOR) 25 MG tablet Take 0.5 tablets (12.5 mg total) by mouth 2 (two) times daily. (Patient not taking: Reported on 02/09/2016) 15 tablet 0   No facility-administered medications prior to  visit.      Allergies:   Patient has no known allergies.   Social History   Social History  . Marital status: Married    Spouse name: N/A  . Number of children: N/A  . Years of education: N/A   Occupational History  . Store Production designer, theatre/television/filmManager at [employer]    Social History Main Topics  . Smoking status: Former Smoker    Packs/day: 0.25    Types: Cigarettes  . Smokeless tobacco: Never Used  . Alcohol use No  . Drug use: No  . Sexual activity: Yes    Birth control/ protection: Other-see comments   Other Topics Concern  . None   Social History Narrative   [place] in [place].     Family History:  The patient's family history includes CAD in his other; Diabetes Mellitus II in his father and mother.   ROS:   Please see the history of present illness.    ROS All other systems reviewed and are negative.   PHYSICAL EXAM:   VS:  BP 122/84 (BP Location: Right Arm)   Pulse 89   Ht 5\' 6"  (1.676 m)   Wt 179 lb 12.8 oz (81.6 kg)   BMI 29.02 kg/m    GEN: Well nourished, well developed, in no acute distress  HEENT: normal  Neck: no JVD, carotid bruits, or masses Cardiac: RRR; no murmurs, rubs, or gallops,no edema  Respiratory:  clear to auscultation bilaterally, normal work of breathing GI: soft, nontender, nondistended, + BS MS: no deformity or atrophy  Skin: warm and dry, no rash Neuro:  Alert and Oriented x 3, Strength and sensation are intact Psych: euthymic mood, full affect  Wt Readings from Last 3 Encounters:  02/09/16 179 lb 12.8 oz (81.6 kg)  01/11/16 183 lb (83 kg)  12/05/15 181 lb 12.8 oz (82.5 kg)      Studies/Labs Reviewed:   EKG:  EKG is not ordered today.   Recent Labs: 11/26/2015: BUN 12; Creatinine, Ser 1.23; Hemoglobin 15.4; Magnesium 2.1; Platelets 184; Potassium 3.6; Sodium 139 01/16/2016: ALT 27   Lipid Panel    Component Value Date/Time   CHOL 74 01/16/2016 0802   TRIG 143 01/16/2016 0802   HDL 34 (L) 01/16/2016 0802   CHOLHDL 2.2 01/16/2016 0802   VLDL 29 01/16/2016 0802   LDLCALC 11 01/16/2016 0802    Additional studies/ records that were reviewed today include:   Echo 11/25/2015 Conclusion     Prox LAD to Mid LAD lesion, 30 %stenosed.  Ost 1st Mrg to 1st Mrg lesion, 30 %stenosed.  The left ventricular systolic function is normal.  LV end diastolic pressure is normal.  The left ventricular ejection fraction is 55-65% by visual estimate.  A STENT PROMUS PREM MR 3.0X16 drug eluting stent was successfully placed.  Mid RCA to Dist RCA lesion, 90 %stenosed.  Post  intervention, there is a 0% residual stenosis.   1. Single vessel obstructive CAD with ulcerated plaque/stenosis in the distal RCA. 2. Normal LV function 3. Normal LV EDP. 4. Successful stenting of the distal RCA with a DES.  Plan: DAPT for one year. Aggressive risk factor modification. Anticipate DC tomorrow.     Echo 11/26/2015 LV EF: 55% -   60%  - Left ventricle: The cavity size was normal. Wall thickness was   normal. Systolic function was normal. The estimated ejection   fraction was in the range of 55% to 60%. Wall motion was  normal;   there were no regional wall motion abnormalities. Left   ventricular diastolic function parameters were normal. - Aortic valve: There was no stenosis. - Mitral valve: There was no significant regurgitation. - Right ventricle: The cavity size was normal. Systolic function   was normal. - Pulmonary arteries: No complete TR doppler jet so unable to   estimate PA systolic pressure. - Inferior vena cava: The vessel was normal in size. The   respirophasic diameter changes were in the normal range (>= 50%),   consistent with normal central venous pressure.  Impressions:  - Normal study.    ASSESSMENT:    1. Controlled type 2 diabetes mellitus with complication, with long-term current use of insulin (HCC)   2. Coronary artery disease involving native coronary artery of native heart without angina pectoris   3. Tobacco abuse      PLAN:  In order of problems listed above:  1. CAD: Denies any chest discomfort since left the hospital. Continue on aspirin and Brilinta for one year then ASA only. He has stopped smoking. Emphasis has been placed on risk factor control.   2. IDDM: On insulin at home, managed by PCP.  3. Hypertriglyceridemia: Currently on high-dose Lipitor. LDL down to 11. Continue therapy for now.   4. Tobacco abuse: He quit smoking since recent MI. I congratulated him on this. Would recommend he not Vape as well.      Medication Adjustments/Labs and Tests Ordered: Current medicines are reviewed at length with the patient today.  Concerns regarding medicines are outlined above.  Medication changes, Labs and Tests ordered today are listed in the Patient Instructions below. Patient Instructions  Continue your current therapy  I will see you in 6 months.    Signed, Peter Swaziland, MD  02/09/2016 3:00 PM    [facility] [address] [place], [place], [place]  [address] Phone: [phone]; Fax: [phone]

## 2016-02-09 ENCOUNTER — Encounter: Payer: Self-pay | Admitting: Cardiology

## 2016-02-09 ENCOUNTER — Ambulatory Visit (INDEPENDENT_AMBULATORY_CARE_PROVIDER_SITE_OTHER): Payer: 59 | Admitting: Cardiology

## 2016-02-09 VITALS — BP 122/84 | HR 89 | Ht 66.0 in | Wt 179.8 lb

## 2016-02-09 DIAGNOSIS — I2510 Atherosclerotic heart disease of native coronary artery without angina pectoris: Secondary | ICD-10-CM

## 2016-02-09 DIAGNOSIS — Z794 Long term (current) use of insulin: Secondary | ICD-10-CM

## 2016-02-09 DIAGNOSIS — Z720 Tobacco use: Secondary | ICD-10-CM

## 2016-02-09 DIAGNOSIS — E118 Type 2 diabetes mellitus with unspecified complications: Secondary | ICD-10-CM

## 2016-02-09 NOTE — Patient Instructions (Signed)
Continue your current therapy  I will see you in 6 months.   

## 2016-02-21 ENCOUNTER — Telehealth: Payer: Self-pay | Admitting: Cardiology

## 2016-02-21 MED ORDER — TICAGRELOR 90 MG PO TABS: 90.0000 mg | ORAL_TABLET | Freq: Two times a day (BID) | ORAL | 3 refills | Status: DC

## 2016-02-21 MED ORDER — ATORVASTATIN CALCIUM 80 MG PO TABS: 80.0000 mg | ORAL_TABLET | Freq: Every day | ORAL | 3 refills | Status: DC

## 2016-02-21 NOTE — Telephone Encounter (Signed)
New message       *STAT* If patient is at the pharmacy, call can be transferred to refill team.   1. Which medications need to be refilled? (please list name of each medication and dose if known) ticagrelor (BRILINTA) 90 MG TABS tablet / atorvastatin (LIPITOR) 80 MG tablet /   2. Which pharmacy/location (including street and city if local pharmacy) is medication to be sent to? [facility] [address] in [place]   3. Do they need a 30 day or 90 day supply? 90 days

## 2016-02-21 NOTE — Telephone Encounter (Signed)
Med refilled to pt pharmacy.

## 2016-04-17 ENCOUNTER — Encounter (HOSPITAL_COMMUNITY): Payer: Self-pay | Admitting: Internal Medicine

## 2016-04-17 NOTE — Progress Notes (Signed)
Mailed patient letter with information about Cardiac Rehab program. MW °

## 2016-05-07 ENCOUNTER — Other Ambulatory Visit: Payer: Self-pay

## 2016-05-07 MED ORDER — METOPROLOL TARTRATE 25 MG PO TABS: 12.5000 mg | ORAL_TABLET | Freq: Two times a day (BID) | ORAL | 3 refills | Status: DC

## 2016-10-20 ENCOUNTER — Other Ambulatory Visit: Payer: Self-pay | Admitting: Physician Assistant

## 2016-10-22 NOTE — Telephone Encounter (Signed)
Refill Request.  

## 2016-12-16 ENCOUNTER — Other Ambulatory Visit: Payer: Self-pay | Admitting: Cardiology

## 2017-03-07 ENCOUNTER — Other Ambulatory Visit: Payer: Self-pay

## 2017-03-07 MED ORDER — METOPROLOL TARTRATE 25 MG PO TABS: 12.5000 mg | ORAL_TABLET | Freq: Two times a day (BID) | ORAL | 3 refills | Status: DC

## 2017-03-17 ENCOUNTER — Other Ambulatory Visit: Payer: Self-pay | Admitting: Cardiology

## 2017-03-18 NOTE — Telephone Encounter (Signed)
Rx(s) sent to pharmacy electronically.  

## 2017-04-17 ENCOUNTER — Other Ambulatory Visit: Payer: Self-pay | Admitting: Cardiology

## 2017-05-18 ENCOUNTER — Other Ambulatory Visit: Payer: Self-pay | Admitting: Cardiology

## 2017-05-22 ENCOUNTER — Other Ambulatory Visit: Payer: Self-pay | Admitting: Cardiology

## 2017-05-23 NOTE — Telephone Encounter (Signed)
REFILL 

## 2017-08-18 ENCOUNTER — Other Ambulatory Visit: Payer: Self-pay | Admitting: Cardiology

## 2017-11-12 ENCOUNTER — Other Ambulatory Visit: Payer: Self-pay | Admitting: Cardiology

## 2018-01-19 ENCOUNTER — Other Ambulatory Visit: Payer: Self-pay | Admitting: Cardiology

## 2018-02-03 ENCOUNTER — Other Ambulatory Visit: Payer: Self-pay | Admitting: Cardiology

## 2018-02-17 ENCOUNTER — Other Ambulatory Visit: Payer: Self-pay

## 2018-02-17 MED ORDER — METOPROLOL TARTRATE 25 MG PO TABS: 12.5000 mg | ORAL_TABLET | Freq: Two times a day (BID) | ORAL | 0 refills | Status: DC

## 2018-02-17 NOTE — Telephone Encounter (Signed)
Rx(s) sent to pharmacy electronically.  

## 2018-02-20 DIAGNOSIS — Z0000 Encounter for general adult medical examination without abnormal findings: Secondary | ICD-10-CM | POA: Diagnosis not present

## 2018-02-20 DIAGNOSIS — I2510 Atherosclerotic heart disease of native coronary artery without angina pectoris: Secondary | ICD-10-CM | POA: Diagnosis not present

## 2018-02-20 DIAGNOSIS — Z794 Long term (current) use of insulin: Secondary | ICD-10-CM | POA: Diagnosis not present

## 2018-02-20 DIAGNOSIS — E1165 Type 2 diabetes mellitus with hyperglycemia: Secondary | ICD-10-CM | POA: Diagnosis not present

## 2018-04-07 ENCOUNTER — Other Ambulatory Visit: Payer: Self-pay | Admitting: Cardiology

## 2018-05-02 DIAGNOSIS — E119 Type 2 diabetes mellitus without complications: Secondary | ICD-10-CM | POA: Diagnosis not present

## 2018-05-02 DIAGNOSIS — I10 Essential (primary) hypertension: Secondary | ICD-10-CM | POA: Diagnosis not present

## 2018-05-02 DIAGNOSIS — I2510 Atherosclerotic heart disease of native coronary artery without angina pectoris: Secondary | ICD-10-CM | POA: Diagnosis not present

## 2018-05-02 DIAGNOSIS — E7800 Pure hypercholesterolemia, unspecified: Secondary | ICD-10-CM | POA: Diagnosis not present

## 2018-05-12 ENCOUNTER — Telehealth: Payer: Self-pay | Admitting: Cardiology

## 2018-05-12 MED ORDER — METOPROLOL TARTRATE 25 MG PO TABS: 12.5000 mg | ORAL_TABLET | Freq: Two times a day (BID) | ORAL | 3 refills | Status: DC

## 2018-05-12 NOTE — Telephone Encounter (Signed)
Rx request from [facility] for Metoprolol Tartrate 25 mg BID set in for 30 day with 3 refills. Includes note that she needs an appointment with Dr. Swaziland.

## 2018-07-10 DIAGNOSIS — M7732 Calcaneal spur, left foot: Secondary | ICD-10-CM | POA: Diagnosis not present

## 2018-07-10 DIAGNOSIS — M71572 Other bursitis, not elsewhere classified, left ankle and foot: Secondary | ICD-10-CM | POA: Diagnosis not present

## 2018-07-10 DIAGNOSIS — M722 Plantar fascial fibromatosis: Secondary | ICD-10-CM | POA: Diagnosis not present

## 2018-07-17 DIAGNOSIS — M722 Plantar fascial fibromatosis: Secondary | ICD-10-CM | POA: Diagnosis not present

## 2018-07-17 DIAGNOSIS — M71571 Other bursitis, not elsewhere classified, right ankle and foot: Secondary | ICD-10-CM | POA: Diagnosis not present

## 2018-07-29 DIAGNOSIS — Z125 Encounter for screening for malignant neoplasm of prostate: Secondary | ICD-10-CM | POA: Diagnosis not present

## 2018-07-29 DIAGNOSIS — E7800 Pure hypercholesterolemia, unspecified: Secondary | ICD-10-CM | POA: Diagnosis not present

## 2018-07-29 DIAGNOSIS — E1165 Type 2 diabetes mellitus with hyperglycemia: Secondary | ICD-10-CM | POA: Diagnosis not present

## 2018-08-14 DIAGNOSIS — E1121 Type 2 diabetes mellitus with diabetic nephropathy: Secondary | ICD-10-CM | POA: Diagnosis not present

## 2018-08-14 DIAGNOSIS — E7800 Pure hypercholesterolemia, unspecified: Secondary | ICD-10-CM | POA: Diagnosis not present

## 2018-08-14 DIAGNOSIS — I10 Essential (primary) hypertension: Secondary | ICD-10-CM | POA: Diagnosis not present

## 2018-08-14 DIAGNOSIS — I2510 Atherosclerotic heart disease of native coronary artery without angina pectoris: Secondary | ICD-10-CM | POA: Diagnosis not present

## 2018-09-08 ENCOUNTER — Other Ambulatory Visit: Payer: Self-pay

## 2018-09-11 ENCOUNTER — Other Ambulatory Visit: Payer: Self-pay

## 2018-11-14 DIAGNOSIS — E7800 Pure hypercholesterolemia, unspecified: Secondary | ICD-10-CM | POA: Diagnosis not present

## 2018-11-14 DIAGNOSIS — I2510 Atherosclerotic heart disease of native coronary artery without angina pectoris: Secondary | ICD-10-CM | POA: Diagnosis not present

## 2018-11-14 DIAGNOSIS — Z23 Encounter for immunization: Secondary | ICD-10-CM | POA: Diagnosis not present

## 2018-11-14 DIAGNOSIS — E1121 Type 2 diabetes mellitus with diabetic nephropathy: Secondary | ICD-10-CM | POA: Diagnosis not present

## 2018-11-14 DIAGNOSIS — E1165 Type 2 diabetes mellitus with hyperglycemia: Secondary | ICD-10-CM | POA: Diagnosis not present

## 2018-11-14 DIAGNOSIS — I10 Essential (primary) hypertension: Secondary | ICD-10-CM | POA: Diagnosis not present

## 2018-12-02 ENCOUNTER — Other Ambulatory Visit: Payer: Self-pay | Admitting: Cardiology

## 2018-12-25 ENCOUNTER — Encounter: Payer: Self-pay | Admitting: Physician Assistant

## 2018-12-25 ENCOUNTER — Other Ambulatory Visit: Payer: Self-pay

## 2018-12-25 ENCOUNTER — Ambulatory Visit (INDEPENDENT_AMBULATORY_CARE_PROVIDER_SITE_OTHER): Payer: BC Managed Care – PPO | Admitting: Physician Assistant

## 2018-12-25 VITALS — BP 124/86 | HR 88 | Temp 97.7°F | Ht 65.5 in | Wt 149.0 lb

## 2018-12-25 DIAGNOSIS — I2510 Atherosclerotic heart disease of native coronary artery without angina pectoris: Secondary | ICD-10-CM | POA: Diagnosis not present

## 2018-12-25 DIAGNOSIS — E785 Hyperlipidemia, unspecified: Secondary | ICD-10-CM

## 2018-12-25 DIAGNOSIS — F17200 Nicotine dependence, unspecified, uncomplicated: Secondary | ICD-10-CM | POA: Diagnosis not present

## 2018-12-25 DIAGNOSIS — E119 Type 2 diabetes mellitus without complications: Secondary | ICD-10-CM | POA: Diagnosis not present

## 2018-12-25 MED ORDER — METOPROLOL TARTRATE 25 MG PO TABS: 12.5000 mg | ORAL_TABLET | Freq: Two times a day (BID) | ORAL | 3 refills | Status: DC

## 2018-12-25 NOTE — Patient Instructions (Signed)
Medication Instructions:   DISCONTINUE BRILINTA  *If you need a refill on your cardiac medications before your next appointment, please call your pharmacy*  Lab Work:  NONE ordered at this time of appointment  If you have labs (blood work) drawn today and your tests are completely normal, you will receive your results only by: Marland Kitchen MyChart Message (if you have MyChart) OR . A paper copy in the mail If you have any lab test that is abnormal or we need to change your treatment, we will call you to review the results.  Testing/Procedures:  NONE ordered at this time of appointment   Follow-Up: At [facility], you and your health needs are our priority.  As part of our continuing mission to provide you with exceptional heart care, we have created designated Provider Care Teams.  These Care Teams include your primary Cardiologist (physician) and Advanced Practice Providers (APPs -  Physician Assistants and Nurse Practitioners) who all work together to provide you with the care you need, when you need it.  Your next appointment:   6-9 MONTHS   The format for your next appointment:   Either In Person or Virtual  Provider:   Peter Martinique, MD  Other Instructions

## 2018-12-25 NOTE — Progress Notes (Signed)
Cardiology Office Note    Date:  12/25/2018   ID:  Christopher Cooper, DOB 1960-10-08, MRN [id number]  PCP:  [name], MD  Cardiologist:  Dr. Swaziland  Chief Complaint  Patient presents with  . Follow-up    seen for Dr. Swaziland.    History of Present Illness:  Christopher Cooper is a 58 y.o. male with PMH of GERD, tobacco use, DM, remote asbestos exposure who recently diagnosed with CAD After presenting to the hospital on 11/24/2015 with chest pain and subsequently ruled in for NSTEMI. He underwent cardiac catheterization on 11/25/2015 showed 30% proximal LAD lesion, 30% OM1 lesion, 90% mid to distal RCA lesion treated with Promus Premiere 3.0 x 16 mm DES, EF 55-65% during cath. Post cath, he was placed on aspirin and Brilinta. Lipid panel obtained on the same day showed total cholesterol 161, triglyceride 309, HDL 32, LDL 67. Echocardiogram was obtained on 11/26/2015, which showed ejection fraction 55-60%, no regional wall motion abnormality, no significant valvular issue.  Patient presents today for cardiology office visit.  He denies any obvious chest pain.  He continue to work at [employer].  He ambulate frequently during work without any issue.  He does have some baseline dyspnea on exertion, however suspect this is related to your still smoking.  He denies any worsening dyspnea on exertion recently.  Unfortunately he has picked up smoking but only smokes occasionally and not every day.  I continue to urge him to quit smoking.  His primary care provider has been prescribing him Brilinta, we discussed that his stent placement was more than 3 years ago.  Given the fact that he has not presented with obvious anginal symptom, we will discontinue the Brilinta at this point.  Last lipid panel I can find in the system was dating back to June 2020, however I was unable to see his total cholesterol and LDL.  We will request the lipid panel from his PCPs office.  Overall he is doing quite well from cardiology  perspective and he can follow-up with Dr. Swaziland in 6 to 9 months.   Past Medical History:  Diagnosis Date  . Asbestos exposure   . CAD in native artery   . Diabetes (HCC)   . GERD (gastroesophageal reflux disease)   . Hyperlipidemia LDL goal <70   . Hypertension   . S/P angioplasty with stent, 11/25/15 to mRCA to dRCA promus premier DES 11/26/2015    Past Surgical History:  Procedure Laterality Date  . ANKLE ARTHROSCOPY Left    after injury  . CARDIAC CATHETERIZATION N/A 11/25/2015   Procedure: Left Heart Cath and Coronary Angiography;  Surgeon: Peter M Swaziland, MD;  Location: [facility] INVASIVE CV LAB;  Service: Cardiovascular;  Laterality: N/A;  . CARDIAC CATHETERIZATION N/A 11/25/2015   Procedure: Coronary Stent Intervention;  Surgeon: Peter M Swaziland, MD;  Location: [facility] INVASIVE CV LAB;  Service: Cardiovascular;  Laterality: N/A;  . CORONARY ANGIOPLASTY WITH STENT PLACEMENT      Current Medications: Outpatient Medications Prior to Visit  Medication Sig Dispense Refill  . aspirin EC 81 MG tablet Take 81 mg by mouth daily.    Marland Kitchen atorvastatin (LIPITOR) 80 MG tablet TAKE 1 TABLET BY MOUTH DAILY AT 6 PM 90 tablet 0  . B-D ULTRAFINE III SHORT PEN 31G X 8 MM MISC USE AS DIRECTED EVERY DAY    . Insulin Glargine-Lixisenatide (SOLIQUA) 100-33 UNT-MCG/ML SOPN Inject 25 Units into the skin daily.    . insulin lispro (HUMALOG)  100 UNIT/ML injection Inject 0.2 mLs (20 Units total) into the skin 2 (two) times daily. 10 mL 2  . JARDIANCE 25 MG TABS tablet Take 25 mg by mouth daily.    . metFORMIN (GLUCOPHAGE-XR) 500 MG 24 hr tablet Take 2,000 mg by mouth every morning.    . nitroGLYCERIN (NITROSTAT) 0.4 MG SL tablet Place 1 tablet (0.4 mg total) under the tongue every 5 (five) minutes as needed for chest pain. 30 tablet 12  . ONETOUCH VERIO test strip AS DIRECTED TO CHECK BLOOD GLUCOSE THREE TIMES DAILY. Z79.4.    . OZEMPIC, 0.25 OR 0.5 MG/DOSE, 2 MG/1.5ML SOPN Inject 0.5 mg into the skin once a week.     . ranitidine (ZANTAC) 150 MG capsule Take 150 mg by mouth daily as needed for heartburn.    . TRESIBA FLEXTOUCH 100 UNIT/ML SOPN FlexTouch Pen INJECT 44 UNITS DAILY. INCREASE AS DIRECTED. MDD   50 REPLACES SOLIQUA SUBCUTANEOUS    . VASCEPA 1 g CAPS Take 2 capsules by mouth 2 (two) times daily.    Marland Kitchen. BRILINTA 90 MG TABS tablet TAKE 1 TABLET (90 MG TOTAL) BY MOUTH 2 (TWO) TIMES DAILY. <PLEASE MAKE APPOINTMENT FOR REFILLS 60 tablet 2  . metoprolol tartrate (LOPRESSOR) 25 MG tablet Take 0.5 tablets (12.5 mg total) by mouth 2 (two) times daily. SCHEDULE OV FOR FURTHER REFILLS. LAST ATTEMPT 30 tablet 3  . metFORMIN (GLUCOPHAGE) 500 MG tablet Take 2 tablets by mouth 2 (two) times daily.     No facility-administered medications prior to visit.      Allergies:   Patient has no known allergies.   Social History   Socioeconomic History  . Marital status: Married    Spouse name: Not on file  . Number of children: Not on file  . Years of education: Not on file  . Highest education level: Not on file  Occupational History  . Occupation: Social research officer, governmenttore Manager at [employer]  Social Needs  . Financial resource strain: Not on file  . Food insecurity    Worry: Not on file    Inability: Not on file  . Transportation needs    Medical: Not on file    Non-medical: Not on file  Tobacco Use  . Smoking status: Former Smoker    Packs/day: 0.25    Types: Cigarettes  . Smokeless tobacco: Never Used  Substance and Sexual Activity  . Alcohol use: No  . Drug use: No  . Sexual activity: Yes    Birth control/protection: Other-see comments  Lifestyle  . Physical activity    Days per week: Not on file    Minutes per session: Not on file  . Stress: Not on file  Relationships  . Social Musicianconnections    Talks on phone: Not on file    Gets together: Not on file    Attends religious service: Not on file    Active member of club or organization: Not on file    Attends meetings of clubs or organizations: Not on file     Relationship status: Not on file  Other Topics Concern  . Not on file  Social History Narrative   He lives in [place] with his wife.     Family History:  The patient's family history includes CAD in an other family member; Diabetes Mellitus II in his father and mother.   ROS:   Please see the history of present illness.    ROS All other systems reviewed and are negative.  PHYSICAL EXAM:   VS:  BP 124/86   Pulse 88   Temp 97.7 F (36.5 C)   Ht 5' 5.5" (1.664 m)   Wt 149 lb (67.6 kg)   SpO2 98%   BMI 24.42 kg/m    GEN: Well nourished, well developed, in no acute distress  HEENT: normal  Neck: no JVD, carotid bruits, or masses Cardiac: RRR; no murmurs, rubs, or gallops,no edema  Respiratory: Mildly diminished breath sounds bilaterally GI: soft, nontender, nondistended, + BS MS: no deformity or atrophy  Skin: warm and dry, no rash Neuro:  Alert and Oriented x 3, Strength and sensation are intact Psych: euthymic mood, full affect  Wt Readings from Last 3 Encounters:  12/25/18 149 lb (67.6 kg)  02/09/16 179 lb 12.8 oz (81.6 kg)  01/11/16 183 lb (83 kg)      Studies/Labs Reviewed:   EKG:  EKG is ordered today.  The ekg ordered today demonstrates normal sinus rhythm without significant ST-T wave changes  Recent Labs: No results found for requested labs within last 8760 hours.   Lipid Panel    Component Value Date/Time   CHOL 74 01/16/2016 0802   TRIG 143 01/16/2016 0802   HDL 34 (L) 01/16/2016 0802   CHOLHDL 2.2 01/16/2016 0802   VLDL 29 01/16/2016 0802   LDLCALC 11 01/16/2016 0802    Additional studies/ records that were reviewed today include:   Cath 11/25/2015  Prox LAD to Mid LAD lesion, 30 %stenosed.  Ost 1st Mrg to 1st Mrg lesion, 30 %stenosed.  The left ventricular systolic function is normal.  LV end diastolic pressure is normal.  The left ventricular ejection fraction is 55-65% by visual estimate.  A STENT PROMUS PREM MR 3.0X16  drug eluting stent was successfully placed.  Mid RCA to Dist RCA lesion, 90 %stenosed.  Post intervention, there is a 0% residual stenosis.   1. Single vessel obstructive CAD with ulcerated plaque/stenosis in the distal RCA. 2. Normal LV function 3. Normal LV EDP. 4. Successful stenting of the distal RCA with a DES.  Plan: DAPT for one year. Aggressive risk factor modification. Anticipate DC tomorrow.     Echo 11/26/2015 LV EF: 55% -   60% Study Conclusions  - Left ventricle: The cavity size was normal. Wall thickness was   normal. Systolic function was normal. The estimated ejection   fraction was in the range of 55% to 60%. Wall motion was normal;   there were no regional wall motion abnormalities. Left   ventricular diastolic function parameters were normal. - Aortic valve: There was no stenosis. - Mitral valve: There was no significant regurgitation. - Right ventricle: The cavity size was normal. Systolic function   was normal. - Pulmonary arteries: No complete TR doppler jet so unable to   estimate PA systolic pressure. - Inferior vena cava: The vessel was normal in size. The   respirophasic diameter changes were in the normal range (>= 50%),   consistent with normal central venous pressure.  Impressions:  - Normal study.    ASSESSMENT:    1. Coronary artery disease involving native coronary artery of native heart without angina pectoris   2. Hyperlipidemia LDL goal <70   3. Controlled type 2 diabetes mellitus without complication, without long-term current use of insulin (HCC)   4. TOBACCO ABUSE      PLAN:  In order of problems listed above:  1. CAD: Since he is more than 1 year out from his stent placement, we  will discontinue Brilinta and continue on the aspirin indefinitely.  He denies any obvious anginal symptom.  2. Hyperlipidemia: Continue on high-dose Lipitor.  Lipid panel followed by primary care provider.  3. DM2: Managed by primary care  provider  4. Tobacco abuse: Continues to smoke intermittently, does not smoke on a daily basis.  We discussed the importance of tobacco cessation and the correlation between tobacco abuse and coronary artery disease.  He has some baseline dyspnea on exertion that has been persistent for the past several years and unchanged, this is likely related to history of smoking.   Medication Adjustments/Labs and Tests Ordered: Current medicines are reviewed at length with the patient today.  Concerns regarding medicines are outlined above.  Medication changes, Labs and Tests ordered today are listed in the Patient Instructions below. Patient Instructions  Medication Instructions:   DISCONTINUE BRILINTA  *If you need a refill on your cardiac medications before your next appointment, please call your pharmacy*  Lab Work:  NONE ordered at this time of appointment  If you have labs (blood work) drawn today and your tests are completely normal, you will receive your results only by: Marland Kitchen MyChart Message (if you have MyChart) OR . A paper copy in the mail If you have any lab test that is abnormal or we need to change your treatment, we will call you to review the results.  Testing/Procedures:  NONE ordered at this time of appointment   Follow-Up: At [facility], you and your health needs are our priority.  As part of our continuing mission to provide you with exceptional heart care, we have created designated Provider Care Teams.  These Care Teams include your primary Cardiologist (physician) and Advanced Practice Providers (APPs -  Physician Assistants and Nurse Practitioners) who all work together to provide you with the care you need, when you need it.  Your next appointment:   6-9 MONTHS   The format for your next appointment:   Either In Person or Virtual  Provider:   Peter Martinique, MD  Other Instructions     Signed, Almyra Deforest, [place]  12/25/2018 9:29 AM    [address] [place], [place],   [address] Phone: [phone]; Fax: [phone]

## 2019-02-26 DIAGNOSIS — I10 Essential (primary) hypertension: Secondary | ICD-10-CM | POA: Diagnosis not present

## 2019-02-26 DIAGNOSIS — E7800 Pure hypercholesterolemia, unspecified: Secondary | ICD-10-CM | POA: Diagnosis not present

## 2019-02-26 DIAGNOSIS — E119 Type 2 diabetes mellitus without complications: Secondary | ICD-10-CM | POA: Diagnosis not present

## 2019-03-19 DIAGNOSIS — E7800 Pure hypercholesterolemia, unspecified: Secondary | ICD-10-CM | POA: Diagnosis not present

## 2019-03-19 DIAGNOSIS — I2510 Atherosclerotic heart disease of native coronary artery without angina pectoris: Secondary | ICD-10-CM | POA: Diagnosis not present

## 2019-03-19 DIAGNOSIS — Z0000 Encounter for general adult medical examination without abnormal findings: Secondary | ICD-10-CM | POA: Diagnosis not present

## 2019-03-19 DIAGNOSIS — E118 Type 2 diabetes mellitus with unspecified complications: Secondary | ICD-10-CM | POA: Diagnosis not present

## 2019-05-14 DIAGNOSIS — E7800 Pure hypercholesterolemia, unspecified: Secondary | ICD-10-CM | POA: Diagnosis not present

## 2019-05-14 DIAGNOSIS — E119 Type 2 diabetes mellitus without complications: Secondary | ICD-10-CM | POA: Diagnosis not present

## 2019-05-14 DIAGNOSIS — I10 Essential (primary) hypertension: Secondary | ICD-10-CM | POA: Diagnosis not present

## 2019-05-21 DIAGNOSIS — E1121 Type 2 diabetes mellitus with diabetic nephropathy: Secondary | ICD-10-CM | POA: Diagnosis not present

## 2019-05-21 DIAGNOSIS — Z794 Long term (current) use of insulin: Secondary | ICD-10-CM | POA: Diagnosis not present

## 2019-05-21 DIAGNOSIS — I2510 Atherosclerotic heart disease of native coronary artery without angina pectoris: Secondary | ICD-10-CM | POA: Diagnosis not present

## 2019-05-21 DIAGNOSIS — E7800 Pure hypercholesterolemia, unspecified: Secondary | ICD-10-CM | POA: Diagnosis not present

## 2019-06-11 ENCOUNTER — Telehealth: Payer: Self-pay | Admitting: *Deleted

## 2019-06-11 NOTE — Telephone Encounter (Signed)
A message was left, re: his follow up visit. 

## 2019-07-03 ENCOUNTER — Telehealth (INDEPENDENT_AMBULATORY_CARE_PROVIDER_SITE_OTHER): Payer: BC Managed Care – PPO | Admitting: Physician Assistant

## 2019-07-03 ENCOUNTER — Telehealth: Payer: Self-pay

## 2019-07-03 ENCOUNTER — Encounter: Payer: Self-pay | Admitting: Physician Assistant

## 2019-07-03 VITALS — BP 111/82 | HR 90 | Ht 66.0 in | Wt 146.0 lb

## 2019-07-03 DIAGNOSIS — I2510 Atherosclerotic heart disease of native coronary artery without angina pectoris: Secondary | ICD-10-CM | POA: Diagnosis not present

## 2019-07-03 DIAGNOSIS — E119 Type 2 diabetes mellitus without complications: Secondary | ICD-10-CM

## 2019-07-03 DIAGNOSIS — E781 Pure hyperglyceridemia: Secondary | ICD-10-CM | POA: Diagnosis not present

## 2019-07-03 NOTE — Telephone Encounter (Signed)
  Patient Consent for Virtual Visit         [name] has provided verbal consent on 07/03/2019 for a virtual visit (video or telephone).   CONSENT FOR VIRTUAL VISIT FOR:  Christopher Cooper  By participating in this virtual visit I agree to the following:  I hereby voluntarily request, consent and authorize [facility] and its employed or contracted physicians, physician assistants, nurse practitioners or other licensed health care professionals (the Practitioner), to provide me with telemedicine health care services (the "Services") as deemed necessary by the treating Practitioner. I acknowledge and consent to receive the Services by the Practitioner via telemedicine. I understand that the telemedicine visit will involve communicating with the Practitioner through live audiovisual communication technology and the disclosure of certain medical information by electronic transmission. I acknowledge that I have been given the opportunity to request an in-person assessment or other available alternative prior to the telemedicine visit and am voluntarily participating in the telemedicine visit.  I understand that I have the right to withhold or withdraw my consent to the use of telemedicine in the course of my care at any time, without affecting my right to future care or treatment, and that the Practitioner or I may terminate the telemedicine visit at any time. I understand that I have the right to inspect all information obtained and/or recorded in the course of the telemedicine visit and may receive copies of available information for a reasonable fee.  I understand that some of the potential risks of receiving the Services via telemedicine include:  Marland Kitchen Delay or interruption in medical evaluation due to technological equipment failure or disruption; . Information transmitted may not be sufficient (e.g. poor resolution of images) to allow for appropriate medical decision making by the Practitioner;  and/or  . In rare instances, security protocols could fail, causing a breach of personal health information.  Furthermore, I acknowledge that it is my responsibility to provide information about my medical history, conditions and care that is complete and accurate to the best of my ability. I acknowledge that Practitioner's advice, recommendations, and/or decision may be based on factors not within their control, such as incomplete or inaccurate data provided by me or distortions of diagnostic images or specimens that may result from electronic transmissions. I understand that the practice of medicine is not an exact science and that Practitioner makes no warranties or guarantees regarding treatment outcomes. I acknowledge that a copy of this consent can be made available to me via my patient portal [facility] MyChart), or I can request a printed copy by calling the office of [facility].    I understand that my insurance will be billed for this visit.   I have read or had this consent read to me. . I understand the contents of this consent, which adequately explains the benefits and risks of the Services being provided via telemedicine.  . I have been provided ample opportunity to ask questions regarding this consent and the Services and have had my questions answered to my satisfaction. . I give my informed consent for the services to be provided through the use of telemedicine in my medical care

## 2019-07-03 NOTE — Telephone Encounter (Signed)
Called patient to discuss AVS instructions gave Christopher Cooper's recommendations and patient voiced understanding. AVS summary mailed to patient.    

## 2019-07-03 NOTE — Progress Notes (Signed)
Virtual Visit via Telephone Note   This visit type was conducted due to national recommendations for restrictions regarding the COVID-19 Pandemic (e.g. social distancing) in an effort to limit this patient's exposure and mitigate transmission in our community.  Due to his co-morbid illnesses, this patient is at least at moderate risk for complications without adequate follow up.  This format is felt to be most appropriate for this patient at this time.  The patient did not have access to video technology/had technical difficulties with video requiring transitioning to audio format only (telephone).  All issues noted in this document were discussed and addressed.  No physical exam could be performed with this format.  Please refer to the patient's chart for his  consent to telehealth for [facility].   The patient was identified using 2 identifiers.  Date:  07/03/2019   ID:  Christopher Cooper, DOB 01-23-1961, MRN [id number]  Patient Location: Home Provider Location: Home  PCP:  Jani Gravel, MD  Cardiologist:  Peter Martinique, MD  Electrophysiologist:  None   Evaluation Performed:  Follow-Up Visit  Chief Complaint:  followup  History of Present Illness:    Christopher Cooper is a 59 y.o. male with PMH of GERD, tobacco use, DM, remote asbestos exposure and CAD. He presented with NSTEMI in 11/2015. Cardiac catheterization performed on 11/25/2015 showed 30% proximal LAD lesion, 30% OM1 lesion, 90% mid to distal RCA lesion treated with PromusPremiere3.0 x 16 mm DES, EF 55-65% during cath. Post cath, he was placed on aspirin and Brilinta. Lipid panel showed elevated triglyceride. Echocardiogram was obtained on 11/26/2015, which showed ejection fraction 55-60%, no regional wall motion abnormality, no significant valvular issue.  I last saw the patient in November 2020 for follow-up, at which time he was doing well.  I discontinued his Brilinta since his stent placement was more than 3 years  ago.  Patient presents today for cardiology office visit.  He denies any recent chest pain or shortness of breath.  He has no lower extremity edema, orthopnea or PND.  He can follow-up in 6 to 70-month.  The patient does not have symptoms concerning for COVID-19 infection (fever, chills, cough, or new shortness of breath).    Past Medical History:  Diagnosis Date  . Asbestos exposure   . CAD in native artery   . Diabetes ([place])   . GERD (gastroesophageal reflux disease)   . Hyperlipidemia LDL goal <70   . Hypertension   . S/P angioplasty with stent, 11/25/15 to mRCA to [place] DES 11/26/2015   Past Surgical History:  Procedure Laterality Date  . ANKLE ARTHROSCOPY Left    after injury  . CARDIAC CATHETERIZATION N/A 11/25/2015   Procedure: Left Heart Cath and Coronary Angiography;  Surgeon: Peter M Martinique, MD;  Location: [place] [facility];  Service: Cardiovascular;  Laterality: N/A;  . CARDIAC CATHETERIZATION N/A 11/25/2015   Procedure: Coronary Stent Intervention;  Surgeon: Peter M Martinique, MD;  Location: [place] [facility];  Service: Cardiovascular;  Laterality: N/A;  . CORONARY ANGIOPLASTY WITH STENT PLACEMENT       Current Meds  Medication Sig  . aspirin EC 81 MG tablet Take 81 mg by mouth daily.  Marland Kitchen atorvastatin (LIPITOR) 80 MG tablet TAKE 1 TABLET BY MOUTH DAILY AT 6 PM (Patient taking differently: Take 80 mg by mouth. )  . B-D ULTRAFINE III SHORT PEN 31G X 8 MM MISC USE AS DIRECTED EVERY DAY  . calcium carbonate (TUMS - DOSED  IN MG ELEMENTAL CALCIUM) 500 MG chewable tablet Chew 1 tablet by mouth daily.  Marland Kitchen JARDIANCE 25 MG TABS tablet Take 25 mg by mouth daily.  . metFORMIN (GLUCOPHAGE-XR) 500 MG 24 hr tablet Take 2,000 mg by mouth every morning.  . metoprolol tartrate (LOPRESSOR) 25 MG tablet Take 0.5 tablets (12.5 mg total) by mouth 2 (two) times daily.  . nitroGLYCERIN (NITROSTAT) 0.4 MG SL tablet Place 1 tablet (0.4 mg total) under the tongue every 5 (five)  minutes as needed for chest pain.  Marland Kitchen Omeprazole (PRILOSEC PO) Take by mouth.  Letta Pate VERIO test strip AS DIRECTED TO CHECK BLOOD GLUCOSE THREE TIMES DAILY. Z79.4.  . OZEMPIC, 1 MG/DOSE, 2 MG/1.5ML SOPN Inject 1 mg into the skin once a week.  . TRESIBA FLEXTOUCH 100 UNIT/ML SOPN FlexTouch Pen INJECT 44 UNITS DAILY. INCREASE AS DIRECTED. MDD   50 REPLACES SOLIQUA SUBCUTANEOUS  . VASCEPA 1 g CAPS Take 2 capsules by mouth 2 (two) times daily.     Allergies:   Patient has no known allergies.   Social History   Tobacco Use  . Smoking status: Former Smoker    Packs/day: 0.25    Types: Cigarettes  . Smokeless tobacco: Never Used  Substance Use Topics  . Alcohol use: No  . Drug use: No     Family Hx: The patient's family history includes CAD in an other family member; Diabetes Mellitus II in his father and mother.  ROS:   Please see the history of present illness.     All other systems reviewed and are negative.   Prior CV studies:   The following studies were reviewed today:  Cath 11/25/2015  Prox LAD to Mid LAD lesion, 30 %stenosed.  Ost 1st Mrg to 1st Mrg lesion, 30 %stenosed.  The left ventricular systolic function is normal.  LV end diastolic pressure is normal.  The left ventricular ejection fraction is 55-65% by visual estimate.  A STENT PROMUS PREM MR 3.0X16 drug eluting stent was successfully placed.  Mid RCA to Dist RCA lesion, 90 %stenosed.  Post intervention, there is a 0% residual stenosis.   1. Single vessel obstructive CAD with ulcerated plaque/stenosis in the distal RCA. 2. Normal LV function 3. Normal LV EDP. 4. Successful stenting of the distal RCA with a DES.  Plan: DAPT for one year. Aggressive risk factor modification. Anticipate DC tomorrow.     Echo 11/26/2015 LV EF: 55% -  60%   Study Conclusions   - Left ventricle: The cavity size was normal. Wall thickness was  normal. Systolic function was normal. The estimated ejection   fraction was in the range of 55% to 60%. Wall motion was normal;  there were no regional wall motion abnormalities. Left  ventricular diastolic function parameters were normal.  - Aortic valve: There was no stenosis.  - Mitral valve: There was no significant regurgitation.  - Right ventricle: The cavity size was normal. Systolic function  was normal.  - Pulmonary arteries: No complete TR doppler jet so unable to  estimate PA systolic pressure.  - Inferior vena cava: The vessel was normal in size. The  respirophasic diameter changes were in the normal range (>= 50%),  consistent with normal central venous pressure.   Impressions:   - Normal study.   Labs/Other Tests and Data Reviewed:    EKG:  An ECG dated 12/26/2018 was personally reviewed today and demonstrated:  Normal sinus rhythm without significant ST-T wave changes.  Recent Labs: No results  found for requested labs within last 8760 hours.   Recent Lipid Panel Lab Results  Component Value Date/Time   CHOL 74 01/16/2016 08:02 AM   TRIG 143 01/16/2016 08:02 AM   HDL 34 (L) 01/16/2016 08:02 AM   CHOLHDL 2.2 01/16/2016 08:02 AM   LDLCALC 11 01/16/2016 08:02 AM    Wt Readings from Last 3 Encounters:  07/03/19 146 lb (66.2 kg)  12/25/18 149 lb (67.6 kg)  02/09/16 179 lb 12.8 oz (81.6 kg)     Objective:    Vital Signs:  BP 111/82   Pulse (!) 115   Ht 5\' 6"  (1.676 m)   Wt 146 lb (66.2 kg)   BMI 23.57 kg/m    VITAL SIGNS:  reviewed  ASSESSMENT & PLAN:    1. CAD: Denies any recent chest pain.  Brilinta was discontinued in 2010.  Continue aspirin and Lipitor  2. Hyperlipidemia: Well-controlled on metoprolol  3. DM 2: On Jardiance and Metformin  COVID-19 Education: The signs and symptoms of COVID-19 were discussed with the patient and how to seek care for testing (follow up with PCP or arrange E-visit).  The importance of social distancing was discussed today.  Time:   Today, I have spent 5  minutes with the patient with telehealth technology discussing the above problems.     Medication Adjustments/Labs and Tests Ordered: Current medicines are reviewed at length with the patient today.  Concerns regarding medicines are outlined above.   Tests Ordered: No orders of the defined types were placed in this encounter.   Medication Changes: No orders of the defined types were placed in this encounter.   Follow Up:  Either In Person or Virtual in 6 month(s)  Signed, 2011, Azalee Course  07/03/2019 8:15 AM    [facility]

## 2019-07-03 NOTE — Patient Instructions (Addendum)
Medication Instructions:  Your physician recommends that you continue on your current medications as directed. Please refer to the Current Medication list given to you today.  *If you need a refill on your cardiac medications before your next appointment, please call your pharmacy*  Lab Work: NONE ordered at this time of appointment   If you have labs (blood work) drawn today and your tests are completely normal, you will receive your results only by: Marland Kitchen MyChart Message (if you have MyChart) OR . A paper copy in the mail If you have any lab test that is abnormal or we need to change your treatment, we will call you to review the results.  Testing/Procedures: NONE ordered at this time of appointment   Follow-Up: At [facility], you and your health needs are our priority.  As part of our continuing mission to provide you with exceptional heart care, we have created designated Provider Care Teams.  These Care Teams include your primary Cardiologist (physician) and Advanced Practice Providers (APPs -  Physician Assistants and Nurse Practitioners) who all work together to provide you with the care you need, when you need it.  We recommend signing up for the patient portal called "MyChart".  Sign up information is provided on this After Visit Summary.  MyChart is used to connect with patients for Virtual Visits (Telemedicine).  Patients are able to view lab/test results, encounter notes, upcoming appointments, etc.  Non-urgent messages can be sent to your provider as well.   To learn more about what you can do with MyChart, go to [email or web address].    Your next appointment:   6-9 month(s)  The format for your next appointment:   In Person  Provider:   Peter Swaziland, MD  Other Instructions

## 2019-08-13 DIAGNOSIS — E119 Type 2 diabetes mellitus without complications: Secondary | ICD-10-CM | POA: Diagnosis not present

## 2019-08-13 DIAGNOSIS — I10 Essential (primary) hypertension: Secondary | ICD-10-CM | POA: Diagnosis not present

## 2019-09-03 DIAGNOSIS — I252 Old myocardial infarction: Secondary | ICD-10-CM | POA: Diagnosis not present

## 2019-09-03 DIAGNOSIS — I10 Essential (primary) hypertension: Secondary | ICD-10-CM | POA: Diagnosis not present

## 2019-09-03 DIAGNOSIS — E1121 Type 2 diabetes mellitus with diabetic nephropathy: Secondary | ICD-10-CM | POA: Diagnosis not present

## 2019-09-03 DIAGNOSIS — E7800 Pure hypercholesterolemia, unspecified: Secondary | ICD-10-CM | POA: Diagnosis not present

## 2019-11-27 DIAGNOSIS — E119 Type 2 diabetes mellitus without complications: Secondary | ICD-10-CM | POA: Diagnosis not present

## 2019-11-27 DIAGNOSIS — E7800 Pure hypercholesterolemia, unspecified: Secondary | ICD-10-CM | POA: Diagnosis not present

## 2019-12-03 DIAGNOSIS — Z23 Encounter for immunization: Secondary | ICD-10-CM | POA: Diagnosis not present

## 2019-12-03 DIAGNOSIS — Z794 Long term (current) use of insulin: Secondary | ICD-10-CM | POA: Diagnosis not present

## 2019-12-03 DIAGNOSIS — I10 Essential (primary) hypertension: Secondary | ICD-10-CM | POA: Diagnosis not present

## 2019-12-03 DIAGNOSIS — E1165 Type 2 diabetes mellitus with hyperglycemia: Secondary | ICD-10-CM | POA: Diagnosis not present

## 2019-12-03 DIAGNOSIS — I2510 Atherosclerotic heart disease of native coronary artery without angina pectoris: Secondary | ICD-10-CM | POA: Diagnosis not present

## 2019-12-24 DIAGNOSIS — M67911 Unspecified disorder of synovium and tendon, right shoulder: Secondary | ICD-10-CM | POA: Diagnosis not present

## 2019-12-31 DIAGNOSIS — M6289 Other specified disorders of muscle: Secondary | ICD-10-CM | POA: Diagnosis not present

## 2019-12-31 DIAGNOSIS — M7541 Impingement syndrome of right shoulder: Secondary | ICD-10-CM | POA: Diagnosis not present

## 2020-01-14 DIAGNOSIS — M6289 Other specified disorders of muscle: Secondary | ICD-10-CM | POA: Diagnosis not present

## 2020-01-14 DIAGNOSIS — M67911 Unspecified disorder of synovium and tendon, right shoulder: Secondary | ICD-10-CM | POA: Diagnosis not present

## 2020-01-14 DIAGNOSIS — M7541 Impingement syndrome of right shoulder: Secondary | ICD-10-CM | POA: Diagnosis not present

## 2020-01-21 DIAGNOSIS — E119 Type 2 diabetes mellitus without complications: Secondary | ICD-10-CM | POA: Diagnosis not present

## 2020-01-21 DIAGNOSIS — M6289 Other specified disorders of muscle: Secondary | ICD-10-CM | POA: Diagnosis not present

## 2020-01-21 DIAGNOSIS — M7541 Impingement syndrome of right shoulder: Secondary | ICD-10-CM | POA: Diagnosis not present

## 2020-01-28 DIAGNOSIS — M7541 Impingement syndrome of right shoulder: Secondary | ICD-10-CM | POA: Diagnosis not present

## 2020-01-28 DIAGNOSIS — M6289 Other specified disorders of muscle: Secondary | ICD-10-CM | POA: Diagnosis not present

## 2020-02-04 DIAGNOSIS — M7541 Impingement syndrome of right shoulder: Secondary | ICD-10-CM | POA: Diagnosis not present

## 2020-02-04 DIAGNOSIS — M6289 Other specified disorders of muscle: Secondary | ICD-10-CM | POA: Diagnosis not present

## 2020-02-11 DIAGNOSIS — M6289 Other specified disorders of muscle: Secondary | ICD-10-CM | POA: Diagnosis not present

## 2020-02-11 DIAGNOSIS — M7541 Impingement syndrome of right shoulder: Secondary | ICD-10-CM | POA: Diagnosis not present

## 2020-02-25 DIAGNOSIS — M25511 Pain in right shoulder: Secondary | ICD-10-CM | POA: Diagnosis not present

## 2020-03-04 DIAGNOSIS — M25511 Pain in right shoulder: Secondary | ICD-10-CM | POA: Diagnosis not present

## 2020-03-10 DIAGNOSIS — E1165 Type 2 diabetes mellitus with hyperglycemia: Secondary | ICD-10-CM | POA: Diagnosis not present

## 2020-03-10 DIAGNOSIS — Z794 Long term (current) use of insulin: Secondary | ICD-10-CM | POA: Diagnosis not present

## 2020-03-10 DIAGNOSIS — E7800 Pure hypercholesterolemia, unspecified: Secondary | ICD-10-CM | POA: Diagnosis not present

## 2020-03-10 DIAGNOSIS — E1121 Type 2 diabetes mellitus with diabetic nephropathy: Secondary | ICD-10-CM | POA: Diagnosis not present

## 2020-03-15 LAB — COLOGUARD: Cologuard: NEGATIVE

## 2020-03-17 DIAGNOSIS — Z125 Encounter for screening for malignant neoplasm of prostate: Secondary | ICD-10-CM | POA: Diagnosis not present

## 2020-03-17 DIAGNOSIS — E7800 Pure hypercholesterolemia, unspecified: Secondary | ICD-10-CM | POA: Diagnosis not present

## 2020-03-17 DIAGNOSIS — E119 Type 2 diabetes mellitus without complications: Secondary | ICD-10-CM | POA: Diagnosis not present

## 2020-03-17 DIAGNOSIS — E1121 Type 2 diabetes mellitus with diabetic nephropathy: Secondary | ICD-10-CM | POA: Diagnosis not present

## 2020-03-17 DIAGNOSIS — I10 Essential (primary) hypertension: Secondary | ICD-10-CM | POA: Diagnosis not present

## 2020-03-24 DIAGNOSIS — E7800 Pure hypercholesterolemia, unspecified: Secondary | ICD-10-CM | POA: Diagnosis not present

## 2020-03-24 DIAGNOSIS — E1165 Type 2 diabetes mellitus with hyperglycemia: Secondary | ICD-10-CM | POA: Diagnosis not present

## 2020-03-24 DIAGNOSIS — Z0000 Encounter for general adult medical examination without abnormal findings: Secondary | ICD-10-CM | POA: Diagnosis not present

## 2020-03-24 DIAGNOSIS — I2510 Atherosclerotic heart disease of native coronary artery without angina pectoris: Secondary | ICD-10-CM | POA: Diagnosis not present

## 2020-04-01 DIAGNOSIS — M7501 Adhesive capsulitis of right shoulder: Secondary | ICD-10-CM | POA: Diagnosis not present

## 2020-04-03 DIAGNOSIS — Z1211 Encounter for screening for malignant neoplasm of colon: Secondary | ICD-10-CM | POA: Diagnosis not present

## 2020-04-03 DIAGNOSIS — Z1212 Encounter for screening for malignant neoplasm of rectum: Secondary | ICD-10-CM | POA: Diagnosis not present

## 2020-04-11 NOTE — Progress Notes (Signed)
Cardiology Office Note    Date:  04/14/2020   ID:  Christopher Cooper, DOB 02-22-1960, MRN [id number]  PCP:  [name], MD  Cardiologist:  Dr. Swaziland  Chief Complaint  Patient presents with  . Coronary Artery Disease    History of Present Illness:  Christopher Cooper is a 60 y.o. male with PMH of GERD, tobacco use, DM, remote asbestos exposure. After presenting to the hospital on 11/24/2015 with chest pain and subsequently ruled in for NSTEMI. He underwent cardiac catheterization on 11/25/2015 showed 30% proximal LAD lesion, 30% OM1 lesion, 90% mid to distal RCA lesion treated with Promus Premier 3.0 x 16 mm DES, EF 55-65% during cath.  Echocardiogram was obtained on 11/26/2015, which showed ejection fraction 55-60%, no regional wall motion abnormality, no significant valvular issue.  On follow up today he is doing well. Denies any chest pain, dyspnea, palpitations, dizziness. Tolerating his medication well. Still smokes 2-3 cigs/week.     Past Medical History:  Diagnosis Date  . Asbestos exposure   . CAD in native artery   . Diabetes ([facility])   . GERD (gastroesophageal reflux disease)   . Hyperlipidemia LDL goal <70   . Hypertension   . S/P angioplasty with stent, 11/25/15 to mRCA to dRCA promus premier DES 11/26/2015    Past Surgical History:  Procedure Laterality Date  . ANKLE ARTHROSCOPY Left    after injury  . CARDIAC CATHETERIZATION N/A 11/25/2015   Procedure: Left Heart Cath and Coronary Angiography;  Surgeon:  M Swaziland, MD;  [facility]: [facility];  Service: Cardiovascular;  Laterality: N/A;  . CARDIAC CATHETERIZATION N/A 11/25/2015   Procedure: Coronary Stent Intervention;  Surgeon:  M Swaziland, MD;  [facility];  Service: Cardiovascular;  Laterality: N/A;  . CORONARY ANGIOPLASTY WITH STENT PLACEMENT      Current Medications: Outpatient Medications Prior to Visit  Medication Sig Dispense Refill  . aspirin EC 81 MG tablet Take 81 mg by  mouth daily.    Marland Kitchen atorvastatin (LIPITOR) 80 MG tablet TAKE 1 TABLET BY MOUTH DAILY AT 6 PM (Patient taking differently: Take 80 mg by mouth.) 90 tablet 0  . B-D ULTRAFINE III SHORT PEN 31G X 8 MM MISC USE AS DIRECTED EVERY DAY    . calcium carbonate (TUMS - DOSED IN MG ELEMENTAL CALCIUM) 500 MG chewable tablet Chew 1 tablet by mouth daily.    . Insulin Glargine-Lixisenatide 100-33 UNT-MCG/ML SOPN Inject 25 Units into the skin daily.    Marland Kitchen JARDIANCE 25 MG TABS tablet Take 25 mg by mouth daily.    . metFORMIN (GLUCOPHAGE-XR) 500 MG 24 hr tablet Take 2,000 mg by mouth every morning.    . nitroGLYCERIN (NITROSTAT) 0.4 MG SL tablet Place 1 tablet (0.4 mg total) under the tongue every 5 (five) minutes as needed for chest pain. 30 tablet 12  . Omeprazole (PRILOSEC PO) Take by mouth.    Letta Pate VERIO test strip AS DIRECTED TO CHECK BLOOD GLUCOSE THREE TIMES DAILY. Z79.4.    . OZEMPIC, 1 MG/DOSE, 2 MG/1.5ML SOPN Inject 1 mg into the skin once a week.    . TRESIBA FLEXTOUCH 100 UNIT/ML SOPN FlexTouch Pen INJECT 44 UNITS DAILY. INCREASE AS DIRECTED. MDD   50 REPLACES SOLIQUA SUBCUTANEOUS    . VASCEPA 1 g CAPS Take 2 capsules by mouth 2 (two) times daily.    . metoprolol tartrate (LOPRESSOR) 25 MG tablet Take 0.5 tablets (12.5 mg total) by mouth 2 (two)  times daily. 90 tablet 3   No facility-administered medications prior to visit.     Allergies:   Patient has no known allergies.   Social History   Socioeconomic History  . Marital status: Married    Spouse name: Not on file  . Number of children: Not on file  . Years of education: Not on file  . Highest education level: Not on file  Occupational History  . Occupation: Social research officer, government at [employer]  . Smoking status: Former Smoker    Packs/day: 0.25    Types: Cigarettes  . Smokeless tobacco: Never Used  Substance and Sexual Activity  . Alcohol use: No  . Drug use: No  . Sexual activity: Yes    Birth control/protection: Other-see  comments  Other Topics Concern  . Not on file  Social History Narrative   He lives in [place] with his wife.   Social Determinants of Health   Financial Resource Strain: Not on file  Food Insecurity: Not on file  Transportation Needs: Not on file  Physical Activity: Not on file  Stress: Not on file  Social Connections: Not on file     Family History:  The patient's family history includes CAD in an other family member; Diabetes Mellitus II in his father and mother.   ROS:   Please see the history of present illness.    ROS All other systems reviewed and are negative.   PHYSICAL EXAM:   VS:  BP 122/71   Pulse 95   Ht 5\' 6"  (1.676 m)   Wt 151 lb 6.4 oz (68.7 kg)   SpO2 97%   BMI 24.44 kg/m    GEN: Well nourished, well developed, in no acute distress  HEENT: normal  Neck: no JVD, carotid bruits, or masses Cardiac: RRR; no murmurs, rubs, or gallops,no edema  Respiratory: Mildly diminished breath sounds bilaterally GI: soft, nontender, nondistended, + BS MS: no deformity or atrophy  Skin: warm and dry, no rash Neuro:  Alert and Oriented x 3, Strength and sensation are intact Psych: euthymic mood, full affect  Wt Readings from Last 3 Encounters:  04/14/20 151 lb 6.4 oz (68.7 kg)  07/03/19 146 lb (66.2 kg)  12/25/18 149 lb (67.6 kg)      Studies/Labs Reviewed:   EKG:  EKG is ordered today.  The ekg ordered today demonstrates normal sinus rhythm Normal Ecg. I have personally reviewed and interpreted this study.   Recent Labs: No results found for requested labs within last 8760 hours.   Lipid Panel    Component Value Date/Time   CHOL 74 01/16/2016 0802   TRIG 143 01/16/2016 0802   HDL 34 (L) 01/16/2016 0802   CHOLHDL 2.2 01/16/2016 0802   VLDL 29 01/16/2016 0802   LDLCALC 11 01/16/2016 0802   Dated 11/27/19: creatinine 1.26. otherwise chemistries normal. Dated 03/10/20: A1c 7.8%.  Dated 03/17/20: cholesterol 125, triglycerides 110, HDL 40, LDL  65  Additional studies/ records that were reviewed today include:   Cath 11/25/2015  Prox LAD to Mid LAD lesion, 30 %stenosed.  Ost 1st Mrg to 1st Mrg lesion, 30 %stenosed.  The left ventricular systolic function is normal.  LV end diastolic pressure is normal.  The left ventricular ejection fraction is 55-65% by visual estimate.  A STENT PROMUS PREM MR 3.0X16 drug eluting stent was successfully placed.  Mid RCA to Dist RCA lesion, 90 %stenosed.  Post intervention, there is a 0% residual stenosis.   1.  Single vessel obstructive CAD with ulcerated plaque/stenosis in the distal RCA. 2. Normal LV function 3. Normal LV EDP. 4. Successful stenting of the distal RCA with a DES.  Plan: DAPT for one year. Aggressive risk factor modification. Anticipate DC tomorrow.     Echo 11/26/2015 LV EF: 55% -   60% Study Conclusions  - Left ventricle: The cavity size was normal. Wall thickness was   normal. Systolic function was normal. The estimated ejection   fraction was in the range of 55% to 60%. Wall motion was normal;   there were no regional wall motion abnormalities. Left   ventricular diastolic function parameters were normal. - Aortic valve: There was no stenosis. - Mitral valve: There was no significant regurgitation. - Right ventricle: The cavity size was normal. Systolic function   was normal. - Pulmonary arteries: No complete TR doppler jet so unable to   estimate PA systolic pressure. - Inferior vena cava: The vessel was normal in size. The   respirophasic diameter changes were in the normal range (>= 50%),   consistent with normal central venous pressure.  Impressions:  - Normal study.    ASSESSMENT:    1. Coronary artery disease involving native coronary artery of native heart without angina pectoris   2. Hyperlipidemia LDL goal <70   3. TOBACCO ABUSE      PLAN:  In order of problems listed above:  1. CAD: s/p PCI of the distal RCA in 2017 with  DES.  He denies any obvious anginal symptom. Continue ASA and statin. Will switch metoprolol tartrate to succinate.  2. Hyperlipidemia: Continue on high-dose Lipitor.  LDL 65 is at goal.   3. DM2: Managed by primary care provider  4. Tobacco abuse: counseled on complete smoking cessation.   Medication Adjustments/Labs and Tests Ordered: Current medicines are reviewed at length with the patient today.  Concerns regarding medicines are outlined above.  Medication changes, Labs and Tests ordered today are listed in the Patient Instructions below. Patient Instructions  We will switch metoprolol to Toprol XL 25 mg once a day  Continue your other therapy  Stop smoking completely    Signed,  Sw[place], MD  04/14/2020 8:59 AM    [facility] [address] [place], [place], [place]  [address] Phone: [phone]; Fax: [phone]

## 2020-04-12 LAB — EXTERNAL GENERIC LAB PROCEDURE: COLOGUARD: NEGATIVE

## 2020-04-12 LAB — COLOGUARD: COLOGUARD: NEGATIVE

## 2020-04-14 ENCOUNTER — Encounter: Payer: Self-pay | Admitting: Cardiology

## 2020-04-14 ENCOUNTER — Other Ambulatory Visit: Payer: Self-pay

## 2020-04-14 ENCOUNTER — Ambulatory Visit: Payer: BC Managed Care – PPO | Admitting: Cardiology

## 2020-04-14 VITALS — BP 122/71 | HR 95 | Ht 66.0 in | Wt 151.4 lb

## 2020-04-14 DIAGNOSIS — E785 Hyperlipidemia, unspecified: Secondary | ICD-10-CM | POA: Diagnosis not present

## 2020-04-14 DIAGNOSIS — F17200 Nicotine dependence, unspecified, uncomplicated: Secondary | ICD-10-CM | POA: Diagnosis not present

## 2020-04-14 DIAGNOSIS — I2510 Atherosclerotic heart disease of native coronary artery without angina pectoris: Secondary | ICD-10-CM | POA: Diagnosis not present

## 2020-04-14 MED ORDER — METOPROLOL SUCCINATE ER 25 MG PO TB24: 25.0000 mg | ORAL_TABLET | Freq: Every day | ORAL | 3 refills | Status: DC

## 2020-04-14 NOTE — Patient Instructions (Signed)
We will switch metoprolol to Toprol XL 25 mg once a day  Continue your other therapy  Stop smoking completely

## 2020-05-11 DIAGNOSIS — M7501 Adhesive capsulitis of right shoulder: Secondary | ICD-10-CM | POA: Diagnosis not present

## 2020-06-30 DIAGNOSIS — E119 Type 2 diabetes mellitus without complications: Secondary | ICD-10-CM | POA: Diagnosis not present

## 2020-06-30 DIAGNOSIS — E7800 Pure hypercholesterolemia, unspecified: Secondary | ICD-10-CM | POA: Diagnosis not present

## 2020-07-07 DIAGNOSIS — E7800 Pure hypercholesterolemia, unspecified: Secondary | ICD-10-CM | POA: Diagnosis not present

## 2020-07-07 DIAGNOSIS — I252 Old myocardial infarction: Secondary | ICD-10-CM | POA: Diagnosis not present

## 2020-07-07 DIAGNOSIS — Z794 Long term (current) use of insulin: Secondary | ICD-10-CM | POA: Diagnosis not present

## 2020-07-07 DIAGNOSIS — E1121 Type 2 diabetes mellitus with diabetic nephropathy: Secondary | ICD-10-CM | POA: Diagnosis not present

## 2020-09-22 DIAGNOSIS — E1165 Type 2 diabetes mellitus with hyperglycemia: Secondary | ICD-10-CM | POA: Diagnosis not present

## 2020-09-22 DIAGNOSIS — E7800 Pure hypercholesterolemia, unspecified: Secondary | ICD-10-CM | POA: Diagnosis not present

## 2020-11-17 DIAGNOSIS — I252 Old myocardial infarction: Secondary | ICD-10-CM | POA: Diagnosis not present

## 2020-11-17 DIAGNOSIS — E1121 Type 2 diabetes mellitus with diabetic nephropathy: Secondary | ICD-10-CM | POA: Diagnosis not present

## 2020-11-17 DIAGNOSIS — Z23 Encounter for immunization: Secondary | ICD-10-CM | POA: Diagnosis not present

## 2020-11-17 DIAGNOSIS — Z794 Long term (current) use of insulin: Secondary | ICD-10-CM | POA: Diagnosis not present

## 2020-11-17 DIAGNOSIS — E7800 Pure hypercholesterolemia, unspecified: Secondary | ICD-10-CM | POA: Diagnosis not present

## 2021-01-12 DIAGNOSIS — I10 Essential (primary) hypertension: Secondary | ICD-10-CM | POA: Diagnosis not present

## 2021-01-12 DIAGNOSIS — E1121 Type 2 diabetes mellitus with diabetic nephropathy: Secondary | ICD-10-CM | POA: Diagnosis not present

## 2021-01-12 DIAGNOSIS — E7800 Pure hypercholesterolemia, unspecified: Secondary | ICD-10-CM | POA: Diagnosis not present

## 2021-01-12 DIAGNOSIS — I252 Old myocardial infarction: Secondary | ICD-10-CM | POA: Diagnosis not present

## 2021-01-19 ENCOUNTER — Other Ambulatory Visit (HOSPITAL_COMMUNITY): Payer: Self-pay

## 2021-01-19 DIAGNOSIS — E7800 Pure hypercholesterolemia, unspecified: Secondary | ICD-10-CM | POA: Diagnosis not present

## 2021-01-19 DIAGNOSIS — Z794 Long term (current) use of insulin: Secondary | ICD-10-CM | POA: Diagnosis not present

## 2021-01-19 DIAGNOSIS — I252 Old myocardial infarction: Secondary | ICD-10-CM | POA: Diagnosis not present

## 2021-01-19 DIAGNOSIS — E1121 Type 2 diabetes mellitus with diabetic nephropathy: Secondary | ICD-10-CM | POA: Diagnosis not present

## 2021-01-19 MED ORDER — OZEMPIC (2 MG/DOSE) 8 MG/3ML ~~LOC~~ SOPN
PEN_INJECTOR | SUBCUTANEOUS | 3 refills | Status: DC
  Filled 2021-04-09: qty 9, 84d supply, fill #0
  Filled 2021-06-28: qty 9, 84d supply, fill #1

## 2021-01-19 MED ORDER — OZEMPIC (2 MG/DOSE) 8 MG/3ML ~~LOC~~ SOPN
PEN_INJECTOR | SUBCUTANEOUS | 3 refills | Status: DC
  Filled 2021-01-19: qty 3, 28d supply, fill #0
  Filled 2021-02-14: qty 3, 28d supply, fill #1
  Filled 2021-03-12: qty 3, 28d supply, fill #2
  Filled 2021-09-17: qty 3, 28d supply, fill #3
  Filled 2021-10-15: qty 3, 28d supply, fill #4
  Filled 2021-11-23: qty 3, 28d supply, fill #5
  Filled 2021-12-21 – 2021-12-24 (×2): qty 3, 28d supply, fill #6

## 2021-01-21 ENCOUNTER — Other Ambulatory Visit (HOSPITAL_COMMUNITY): Payer: Self-pay

## 2021-02-14 ENCOUNTER — Other Ambulatory Visit (HOSPITAL_COMMUNITY): Payer: Self-pay

## 2021-03-13 ENCOUNTER — Other Ambulatory Visit (HOSPITAL_COMMUNITY): Payer: Self-pay

## 2021-03-22 ENCOUNTER — Other Ambulatory Visit: Payer: Self-pay

## 2021-03-22 MED ORDER — METOPROLOL SUCCINATE ER 25 MG PO TB24: 25.0000 mg | ORAL_TABLET | Freq: Every day | ORAL | 3 refills | Status: DC

## 2021-03-23 DIAGNOSIS — E119 Type 2 diabetes mellitus without complications: Secondary | ICD-10-CM | POA: Diagnosis not present

## 2021-03-23 DIAGNOSIS — Z125 Encounter for screening for malignant neoplasm of prostate: Secondary | ICD-10-CM | POA: Diagnosis not present

## 2021-03-23 DIAGNOSIS — E7800 Pure hypercholesterolemia, unspecified: Secondary | ICD-10-CM | POA: Diagnosis not present

## 2021-03-23 DIAGNOSIS — Z0000 Encounter for general adult medical examination without abnormal findings: Secondary | ICD-10-CM | POA: Diagnosis not present

## 2021-04-10 ENCOUNTER — Other Ambulatory Visit (HOSPITAL_COMMUNITY): Payer: Self-pay

## 2021-04-11 ENCOUNTER — Other Ambulatory Visit (HOSPITAL_COMMUNITY): Payer: Self-pay

## 2021-04-12 DIAGNOSIS — E119 Type 2 diabetes mellitus without complications: Secondary | ICD-10-CM | POA: Diagnosis not present

## 2021-04-28 ENCOUNTER — Ambulatory Visit: Payer: BC Managed Care – PPO | Admitting: Registered Nurse

## 2021-05-13 DIAGNOSIS — E119 Type 2 diabetes mellitus without complications: Secondary | ICD-10-CM | POA: Diagnosis not present

## 2021-05-18 ENCOUNTER — Ambulatory Visit (HOSPITAL_BASED_OUTPATIENT_CLINIC_OR_DEPARTMENT_OTHER): Payer: BC Managed Care – PPO | Admitting: Family Medicine

## 2021-05-18 ENCOUNTER — Encounter (HOSPITAL_BASED_OUTPATIENT_CLINIC_OR_DEPARTMENT_OTHER): Payer: Self-pay | Admitting: Family Medicine

## 2021-05-18 VITALS — BP 117/88 | HR 86 | Temp 98.7°F | Ht 66.0 in | Wt 139.4 lb

## 2021-05-18 DIAGNOSIS — M25511 Pain in right shoulder: Secondary | ICD-10-CM | POA: Diagnosis not present

## 2021-05-18 DIAGNOSIS — I252 Old myocardial infarction: Secondary | ICD-10-CM | POA: Diagnosis not present

## 2021-05-18 DIAGNOSIS — G8929 Other chronic pain: Secondary | ICD-10-CM

## 2021-05-18 DIAGNOSIS — Z794 Long term (current) use of insulin: Secondary | ICD-10-CM | POA: Diagnosis not present

## 2021-05-18 DIAGNOSIS — E1121 Type 2 diabetes mellitus with diabetic nephropathy: Secondary | ICD-10-CM

## 2021-05-18 DIAGNOSIS — M25512 Pain in left shoulder: Secondary | ICD-10-CM

## 2021-05-18 LAB — POCT GLYCOSYLATED HEMOGLOBIN (HGB A1C)

## 2021-05-18 MED ORDER — TRESIBA FLEXTOUCH 100 UNIT/ML ~~LOC~~ SOPN: 36.0000 [IU] | PEN_INJECTOR | Freq: Every day | SUBCUTANEOUS | 1 refills | Status: DC

## 2021-05-18 NOTE — Progress Notes (Signed)
? ?New Patient Office Visit ? ?Subjective:  ?Patient ID: Christopher Cooper, male    DOB: 10/21/1960  Age: 61 y.o. MRN: [id number] ? ?CC:  ?Chief Complaint  ?Patient presents with  ? New Patient (Initial Visit)  ? ? ?HPI ?Christopher ForemanRiley D Nolte is a 61 year old male presenting to establish in clinic.  He reports past medical history of diabetes, NSTEMI with stent placement in 2017, hyperlipidemia, tobacco use. ? ?Diabetes: Current medications include Tresiba 36 units daily, metformin, 2000 mg daily, Jardiance.  Reports that he is tolerating current medications well.  Feels that his fasting blood sugar has been fairly well controlled lately.  Occasionally he will have some hypoglycemic symptoms such as feeling shaky, feels this happens about twice a month.  His most recent hemoglobin A1c which he is able to pull up electronically was in December 2022 and was 7.9% at that time.  Prior to that his A1c had been 6.5% a few months before. ? ?CAD, history of NSTEMI: Has had prior stent placement with cardiology, indicates that he follows up with cardiology about once yearly.  Has not had any recent chest pain.  Medications at present include ASA 81, atorvastatin, metoprolol.  He last saw cardiology about 1 year ago, due for follow-up at this time.  Patient also had recent lipid panel a few months ago which showed good control of total cholesterol as well as LDL. ? ?He does indicate that he continues to smoke, this is intermittent. ? ?[place]  He currently works as a Production designer, theatre/television/filmmanager at [employer].  Outside of work he enjoys spending time with family, working on house projects, go to the shooting range, riding his bike. ? ?Past Medical History:  ?Diagnosis Date  ? Asbestos exposure   ? CAD in native artery   ? Diabetes (HCC)   ? GERD (gastroesophageal reflux disease)   ? Hyperlipidemia LDL goal <70   ? Hypertension   ? S/P angioplasty with stent, 11/25/15 to mRCA to dRCA promus premier DES 11/26/2015  ? ? ?Past  Surgical History:  ?Procedure Laterality Date  ? ANKLE ARTHROSCOPY Left   ? after injury  ? CARDIAC CATHETERIZATION N/A 11/25/2015  ? Procedure: Left Heart Cath and Coronary Angiography;  Surgeon: Peter M SwazilandJordan, MD;  Location: [facility] INVASIVE CV LAB;  Service: Cardiovascular;  Laterality: N/A;  ? CARDIAC CATHETERIZATION N/A 11/25/2015  ? Procedure: Coronary Stent Intervention;  Surgeon: Peter M SwazilandJordan, MD;  [facility];  Service: Cardiovascular;  Laterality: N/A;  ? CORONARY ANGIOPLASTY WITH STENT PLACEMENT    ? ? ?Family History  ?Problem Relation Age of Onset  ? Diabetes Mellitus II Mother   ? Diabetes Mellitus II Father   ? CAD Other   ?     Reports multiple distant relatives with history of MI  ? ? ?Social History  ? ?Socioeconomic History  ? Marital status: Married  ?  Spouse name: Not on file  ? Number of children: Not on file  ? Years of education: Not on file  ? Highest education level: Not on file  ?Occupational History  ? Occupation: Social research officer, governmenttore Manager at [employer]  ?Tobacco Use  ? Smoking status: Former  ?  Packs/day: 0.25  ?  Types: Cigarettes  ? Smokeless tobacco: Never  ?Substance and Sexual Activity  ? Alcohol use: No  ? Drug use: No  ? Sexual activity: Yes  ?  Birth control/protection: Other-see comments  ?Other Topics Concern  ? Not on  file  ?Social History Narrative  ? He lives in [place] with his wife.  ? ?Social Determinants of Health  ? ?Financial Resource Strain: Not on file  ?Food Insecurity: Not on file  ?Transportation Needs: Not on file  ?Physical Activity: Not on file  ?Stress: Not on file  ?Social Connections: Not on file  ?Intimate Partner Violence: Not on file  ? ? ?Objective:  ? ?Today's Vitals: BP 117/88   Pulse 86   Temp 98.7 ?F (37.1 ?C)   Ht 5\' 6"  (1.676 m)   Wt 139 lb 6.4 oz (63.2 kg)   SpO2 97%   BMI 22.50 kg/m?  ? ?Physical Exam ? ?61 year old male in no acute distress ?Cardiovascular exam with regular rate and rhythm ?Lungs clear to auscultation  bilaterally ? ?Assessment & Plan:  ? ?Problem List Items Addressed This Visit   ? ?  ? Endocrine  ? Diabetes mellitus (HCC) - Primary  ?  Most recent hemoglobin A1c was elevated at 7.9%, would ideally like better control of blood sugars, although patient is recently reporting occasional hypoglycemic episodes ?Will need to check A1c in order to determine current control and any medication changes which would be recommended ?He did also have relatively recent urine microalbumin/creatinine ratio completed which was normal, this was about 6 months ago ?Will need to continue with retinopathy screening, neuropathy screening ?Will need to determine if patient has had pneumococcal vaccination as well ?  ?  ? Relevant Medications  ? TRESIBA FLEXTOUCH 100 UNIT/ML FlexTouch Pen  ? Other Relevant Orders  ? POCT glycosylated hemoglobin (Hb A1C)  ?  ? Other  ? Bilateral shoulder pain  ?  This has been an ongoing problem for patient, has had chronic right shoulder pain with prior evaluation, has more recent onset left shoulder pain.  He has seen an orthopedic surgeon in the past, however would prefer to have evaluation with the provider ?We will refer the patient to Dr. [id number] here at [facility] for further evaluation ?  ?  ? Relevant Orders  ? Ambulatory referral to Orthopedic Surgery  ? History of non-ST elevation myocardial infarction (NSTEMI)  ?  Recommend continuing with current medication regimen including statin, metoprolol ?Recommend follow-up with cardiology, has been a low bit over 1 year since last visit, recommend scheduling annual follow-up in the near future ?Recommend smoking cessation due to history of NSTEMI as well as potential risk for various cancers ?  ?  ? ? ?Outpatient Encounter Medications as of 05/18/2021  ?Medication Sig  ? aspirin EC 81 MG tablet Take 81 mg by mouth daily.  ? atorvastatin (LIPITOR) 80 MG tablet TAKE 1 TABLET BY MOUTH DAILY AT 6 PM (Patient taking differently: Take 80 mg by mouth.)  ? B-D  ULTRAFINE III SHORT PEN 31G X 8 MM MISC USE AS DIRECTED EVERY DAY  ? calcium carbonate (TUMS - DOSED IN MG ELEMENTAL CALCIUM) 500 MG chewable tablet Chew 1 tablet by mouth daily.  ? JARDIANCE 25 MG TABS tablet Take 25 mg by mouth daily.  ? metFORMIN (GLUCOPHAGE-XR) 500 MG 24 hr tablet Take 2,000 mg by mouth every morning.  ? metoprolol succinate (TOPROL XL) 25 MG 24 hr tablet Take 1 tablet (25 mg total) by mouth daily.  ? nitroGLYCERIN (NITROSTAT) 0.4 MG SL tablet Place 1 tablet (0.4 mg total) under the tongue every 5 (five) minutes as needed for chest pain.  ? Omeprazole (PRILOSEC PO) Take by mouth.  ? ONETOUCH VERIO test strip AS DIRECTED  TO CHECK BLOOD GLUCOSE THREE TIMES DAILY. Z79.4.  ? Semaglutide, 2 MG/DOSE, (OZEMPIC, 2 MG/DOSE,) 8 MG/3ML SOPN Inject 2 mg subcutaneous once a week  ? VASCEPA 1 g CAPS Take 2 capsules by mouth 2 (two) times daily.  ? OZEMPIC, 1 MG/DOSE, 2 MG/1.5ML SOPN Inject 1 mg into the skin once a week. (Patient not taking: Reported on 05/18/2021)  ? Semaglutide, 2 MG/DOSE, (OZEMPIC, 2 MG/DOSE,) 8 MG/3ML SOPN Inject 2 mg subcutaneously once a week (Patient not taking: Reported on 05/18/2021)  ? TRESIBA FLEXTOUCH 100 UNIT/ML FlexTouch Pen Inject 36 Units into the skin daily.  ? [DISCONTINUED] Insulin Glargine-Lixisenatide 100-33 UNT-MCG/ML SOPN Inject 25 Units into the skin daily. (Patient not taking: Reported on 05/18/2021)  ? [DISCONTINUED] TRESIBA FLEXTOUCH 100 UNIT/ML SOPN FlexTouch Pen INJECT 44 UNITS DAILY. INCREASE AS DIRECTED. MDD   50 REPLACES SOLIQUA SUBCUTANEOUS (Patient not taking: Reported on 05/18/2021)  ? ?No facility-administered encounter medications on file as of 05/18/2021.  ? ? ?Follow-up: Return in about 6 weeks (around 06/29/2021) for DM.  Plan for close follow-up in about 6 weeks for further review of chronic medical conditions, particularly due to diabetes ? ?Maleena Eddleman J De [place], MD ? ?

## 2021-05-18 NOTE — Assessment & Plan Note (Signed)
Recommend continuing with current medication regimen including statin, metoprolol ?Recommend follow-up with cardiology, has been a low bit over 1 year since last visit, recommend scheduling annual follow-up in the near future ?Recommend smoking cessation due to history of NSTEMI as well as potential risk for various cancers ?

## 2021-05-18 NOTE — Assessment & Plan Note (Signed)
This has been an ongoing problem for patient, has had chronic right shoulder pain with prior evaluation, has more recent onset left shoulder pain.  He has seen an orthopedic surgeon in the past, however would prefer to have evaluation with the provider ?We will refer the patient to Dr. Steward Drone here at [facility] for further evaluation ?

## 2021-05-18 NOTE — Assessment & Plan Note (Signed)
Most recent hemoglobin A1c was elevated at 7.9%, would ideally like better control of blood sugars, although patient is recently reporting occasional hypoglycemic episodes ?Will need to check A1c in order to determine current control and any medication changes which would be recommended ?He did also have relatively recent urine microalbumin/creatinine ratio completed which was normal, this was about 6 months ago ?Will need to continue with retinopathy screening, neuropathy screening ?Will need to determine if patient has had pneumococcal vaccination as well ?

## 2021-05-18 NOTE — Patient Instructions (Signed)
Diabetes Mellitus Basics  Diabetes mellitus, or diabetes, is a long-term (chronic) disease. It occurs when the body does not properly use sugar (glucose) that is released from food after you eat. Diabetes mellitus may be caused by one or both of these problems: Your pancreas does not make enough of a hormone called insulin. Your body does not react in a normal way to the insulin that it makes. Insulin lets glucose enter cells in your body. This gives you energy. If you have diabetes, glucose cannot get into cells. This causes high blood glucose (hyperglycemia). How to treat and manage diabetes You may need to take insulin or other diabetes medicines daily to keep your glucose in balance. If you are prescribed insulin, you will learn how to give yourself insulin by injection. You may need to adjust the amount of insulin youtake based on the foods that you eat. You will need to check your blood glucose levels using a glucose monitor as told by your health care provider. The readings can help determine if you havelow or high blood glucose. Generally, you should have these blood glucose levels: Before meals (preprandial): 80-130 mg/dL (4.4-7.2 mmol/L). After meals (postprandial): below 180 mg/dL (10 mmol/L). Hemoglobin A1c (HbA1c) level: less than 7%. Your health care provider will set treatment goals for you. Keep all follow-up visits. This is important. Follow these instructions at home: Diabetes medicines Take your diabetes medicines every day as told by your health care provider. List your diabetes medicines here: Name of medicine: ______________________________ Amount (dose): _______________ Time (a.m./p.m.): _______________ Notes: ___________________________________ Name of medicine: ______________________________ Amount (dose): _______________ Time (a.m./p.m.): _______________ Notes: ___________________________________ Name of medicine: ______________________________ Amount (dose):  _______________ Time (a.m./p.m.): _______________ Notes: ___________________________________ Insulin If you use insulin, list the types of insulin you use here: Insulin type: ______________________________ Amount (dose): _______________ Time (a.m./p.m.): _______________Notes: ___________________________________ Insulin type: ______________________________ Amount (dose): _______________ Time (a.m./p.m.): _______________ Notes: ___________________________________ Insulin type: ______________________________ Amount (dose): _______________ Time (a.m./p.m.): _______________ Notes: ___________________________________ Insulin type: ______________________________ Amount (dose): _______________ Time (a.m./p.m.): _______________ Notes: ___________________________________ Insulin type: ______________________________ Amount (dose): _______________ Time (a.m./p.m.): _______________ Notes: ___________________________________ Managing blood glucose  Check your blood glucose levels using a glucose monitor as told by your healthcare provider. Write down the times that you check your glucose levels here: Time: _______________ Notes: ___________________________________ Time: _______________ Notes: ___________________________________ Time: _______________ Notes: ___________________________________ Time: _______________ Notes: ___________________________________ Time: _______________ Notes: ___________________________________ Time: _______________ Notes: ___________________________________  Low blood glucose Low blood glucose (hypoglycemia) is when glucose is at or below 70 mg/dL (3.9 mmol/L). Symptoms may include: Feeling: Hungry. Sweaty and clammy. Irritable or easily upset. Dizzy. Sleepy. Having: A fast heartbeat. A headache. A change in your vision. Numbness around the mouth, lips, or tongue. Having trouble with: Moving (coordination). Sleeping. Treating low blood glucose To treat low blood  glucose, eat or drink something containing sugar right away. If you can think clearly and swallow safely, follow the 15:15 rule: Take 15 grams of a fast-acting carb (carbohydrate), as told by your health care provider. Some fast-acting carbs are: Glucose tablets: take 3-4 tablets. Hard candy: eat 3-5 pieces. Fruit juice: drink 4 oz (120 mL). Regular (not diet) soda: drink 4-6 oz (120-180 mL). Honey or sugar: eat 1 Tbsp (15 mL). Check your blood glucose levels 15 minutes after you take the carb. If your glucose is still at or below 70 mg/dL (3.9 mmol/L), take 15 grams of a carb again. If your glucose does not go above 70 mg/dL (3.9 mmol/L) after 3 tries, get   help right away. After your glucose goes back to normal, eat a meal or a snack within 1 hour. Treating very low blood glucose If your glucose is at or below 54 mg/dL (3 mmol/L), you have very low blood glucose (severe hypoglycemia). This is an emergency. Do not wait to see if the symptoms will go away. Get medical help right away. Call your local emergency services (911 in the [place] Do not drive yourself to the hospital. Questions to ask your health care provider Should I talk with a diabetes educator? What equipment will I need to care for myself at home? What diabetes medicines do I need? When should I take them? How often do I need to check my blood glucose levels? What number can I call if I have questions? When is my follow-up visit? Where can I find a support group for people with diabetes? Where to find more information American Diabetes Association: www.diabetes.org Association of Diabetes Care and Education Specialists: www.diabeteseducator.org Contact a health care provider if: Your blood glucose is at or above 240 mg/dL (13.3 mmol/L) for 2 days in a row. You have been sick or have had a fever for 2 days or more, and you are not getting better. You have any of these problems for more than 6 hours: You cannot eat or  drink. You feel nauseous. You vomit. You have diarrhea. Get help right away if: Your blood glucose is lower than 54 mg/dL (3 mmol/L). You get confused. You have trouble thinking clearly. You have trouble breathing. These symptoms may represent a serious problem that is an emergency. Do not wait to see if the symptoms will go away. Get medical help right away. Call your local emergency services (911 in the [place] Do not drive yourself to the hospital. Summary Diabetes mellitus is a chronic disease that occurs when the body does not properly use sugar (glucose) that is released from food after you eat. Take insulin and diabetes medicines as told. Check your blood glucose every day, as often as told. Keep all follow-up visits. This is important. This information is not intended to replace advice given to you by your health care provider. Make sure you discuss any questions you have with your healthcare provider. Document Revised: 06/02/2019 Document Reviewed: 06/02/2019 Elsevier Patient Education  2022 [employer]  

## 2021-05-30 ENCOUNTER — Other Ambulatory Visit (HOSPITAL_BASED_OUTPATIENT_CLINIC_OR_DEPARTMENT_OTHER): Payer: Self-pay | Admitting: Orthopaedic Surgery

## 2021-05-30 DIAGNOSIS — M25511 Pain in right shoulder: Secondary | ICD-10-CM

## 2021-05-31 ENCOUNTER — Ambulatory Visit (HOSPITAL_BASED_OUTPATIENT_CLINIC_OR_DEPARTMENT_OTHER): Payer: BC Managed Care – PPO | Admitting: Orthopaedic Surgery

## 2021-05-31 ENCOUNTER — Ambulatory Visit (HOSPITAL_BASED_OUTPATIENT_CLINIC_OR_DEPARTMENT_OTHER)
Admission: RE | Admit: 2021-05-31 | Discharge: 2021-05-31 | Disposition: A | Discharge status: Home / Self Care | Payer: BC Managed Care – PPO | Source: Ambulatory Visit | Attending: Orthopaedic Surgery | Admitting: Orthopaedic Surgery

## 2021-05-31 DIAGNOSIS — M25511 Pain in right shoulder: Secondary | ICD-10-CM | POA: Insufficient documentation

## 2021-05-31 DIAGNOSIS — G8929 Other chronic pain: Secondary | ICD-10-CM | POA: Diagnosis not present

## 2021-05-31 DIAGNOSIS — M25512 Pain in left shoulder: Secondary | ICD-10-CM | POA: Insufficient documentation

## 2021-05-31 DIAGNOSIS — M75111 Incomplete rotator cuff tear or rupture of right shoulder, not specified as traumatic: Secondary | ICD-10-CM | POA: Diagnosis not present

## 2021-05-31 NOTE — Progress Notes (Signed)
? ?                            ? ? ?Chief Complaint: right greater than left shoulder pain ?  ? ? ?History of Present Illness:  ? ? ?Christopher Cooper is a 61 y.o. male hand dominant male presents with right worse than left shoulder pain which has now been going on for 2 years at least.  He states that the right shoulder has been bothering significantly more.  He does state that approximately 2 years ago he was sitting down and went to reach behind the back and felt a significant sharp pain.  Since that time he has had limited motion and pain overhead.  He has pain when he lies directly on the side.  He has pain that radiates down to the lateral aspect of the arm when he is laying on that side or with overhead activity.  He does feel weak overhead.  He previously has been treated at [facility] orthopedics where he was referred for physical therapy for multiple months.  This he states felt like it overall made her shoulder worse.  He did have 1 injection performed as well a year prior which gave him little to no relief.  He feels like he is back to baseline at this point.  He smokes approximately 1 pack of cigarettes a month.  He does have type 2 diabetes.  He does also have a history of a cardiac stent. ? ? ? ?Surgical History:   ?None ? ?PMH/PSH/Family History/Social History/Meds/Allergies:   ? ?Past Medical History:  ?Diagnosis Date  ? Asbestos exposure   ? CAD in native artery   ? Diabetes (HCC)   ? GERD (gastroesophageal reflux disease)   ? Hyperlipidemia LDL goal <70   ? Hypertension   ? S/P angioplasty with stent, 11/25/15 to mRCA to dRCA promus premier DES 11/26/2015  ? ?Past Surgical History:  ?Procedure Laterality Date  ? ANKLE ARTHROSCOPY Left   ? after injury  ? CARDIAC CATHETERIZATION N/A 11/25/2015  ? Procedure: Left Heart Cath and Coronary Angiography;  Surgeon: Peter M Swaziland, MD;  Location: [facility] INVASIVE CV LAB;  Service: Cardiovascular;  Laterality: N/A;  ? CARDIAC CATHETERIZATION N/A 11/25/2015  ?  Procedure: Coronary Stent Intervention;  Surgeon: Peter M Swaziland, MD;  Location: [facility] INVASIVE CV LAB;  Service: Cardiovascular;  Laterality: N/A;  ? CORONARY ANGIOPLASTY WITH STENT PLACEMENT    ? ?Social History  ? ?Socioeconomic History  ? Marital status: Married  ?  Spouse name: Not on file  ? Number of children: Not on file  ? Years of education: Not on file  ? Highest education level: Not on file  ?Occupational History  ? Occupation: Social research officer, government at [employer]  ?Tobacco Use  ? Smoking status: Former  ?  Packs/day: 0.25  ?  Types: Cigarettes  ? Smokeless tobacco: Never  ?Substance and Sexual Activity  ? Alcohol use: No  ? Drug use: No  ? Sexual activity: Yes  ?  Birth control/protection: Other-see comments  ?Other Topics Concern  ? Not on file  ?Social History Narrative  ? He lives in [place] with his wife.  ? ?Social Determinants of Health  ? ?Financial Resource Strain: Not on file  ?Food Insecurity: Not on file  ?Transportation Needs: Not on file  ?Physical Activity: Not on file  ?Stress: Not on file  ?Social Connections: Not on file  ? ?Family  History  ?Problem Relation Age of Onset  ? Diabetes Mellitus II Mother   ? Diabetes Mellitus II Father   ? CAD Other   ?     Reports multiple distant relatives with history of MI  ? ?No Known Allergies ?Current Outpatient Medications  ?Medication Sig Dispense Refill  ? aspirin EC 81 MG tablet Take 81 mg by mouth daily.    ? atorvastatin (LIPITOR) 80 MG tablet TAKE 1 TABLET BY MOUTH DAILY AT 6 PM (Patient taking differently: Take 80 mg by mouth.) 90 tablet 0  ? B-D ULTRAFINE III SHORT PEN 31G X 8 MM MISC USE AS DIRECTED EVERY DAY    ? calcium carbonate (TUMS - DOSED IN MG ELEMENTAL CALCIUM) 500 MG chewable tablet Chew 1 tablet by mouth daily.    ? JARDIANCE 25 MG TABS tablet Take 25 mg by mouth daily.    ? metFORMIN (GLUCOPHAGE-XR) 500 MG 24 hr tablet Take 2,000 mg by mouth every morning.    ? metoprolol succinate (TOPROL XL) 25 MG 24 hr tablet Take 1 tablet (25 mg  total) by mouth daily. 90 tablet 3  ? nitroGLYCERIN (NITROSTAT) 0.4 MG SL tablet Place 1 tablet (0.4 mg total) under the tongue every 5 (five) minutes as needed for chest pain. 30 tablet 12  ? Omeprazole (PRILOSEC PO) Take by mouth.    ? ONETOUCH VERIO test strip AS DIRECTED TO CHECK BLOOD GLUCOSE THREE TIMES DAILY. Z79.4.    ? OZEMPIC, 1 MG/DOSE, 2 MG/1.5ML SOPN Inject 1 mg into the skin once a week. (Patient not taking: Reported on 05/18/2021)    ? Semaglutide, 2 MG/DOSE, (OZEMPIC, 2 MG/DOSE,) 8 MG/3ML SOPN Inject 2 mg subcutaneous once a week 12 mL 3  ? Semaglutide, 2 MG/DOSE, (OZEMPIC, 2 MG/DOSE,) 8 MG/3ML SOPN Inject 2 mg subcutaneously once a week (Patient not taking: Reported on 05/18/2021) 12 mL 3  ? TRESIBA FLEXTOUCH 100 UNIT/ML FlexTouch Pen Inject 36 Units into the skin daily. 15 mL 1  ? VASCEPA 1 g CAPS Take 2 capsules by mouth 2 (two) times daily.    ? ?No current facility-administered medications for this visit.  ? ?No results found. ? ?Review of Systems:   ?A ROS was performed including pertinent positives and negatives as documented in the HPI. ? ?Physical Exam :   ?Constitutional: NAD and appears stated age ?Neurological: Alert and oriented ?Psych: Appropriate affect and cooperative ?There were no vitals taken for this visit.  ? ?Comprehensive Musculoskeletal Exam:   ? ?Musculoskeletal Exam    ?Inspection Right Left  ?Skin No atrophy or winging No atrophy or winging  ?Palpation    ?Tenderness Lateral deltoid Lateral deltoid  ?Range of Motion    ?Flexion (passive) 150 170  ?Flexion (active) 140 170  ?Abduction 140 170  ?ER at the side 50 70  ?Can reach behind back to L3 T12  ?Strength    ? 4/5 supra and infraspinatus, positive belly press Full with pain  ?Special Tests    ?Pseudoparalytic No No  ?Neurologic    ?Fires PIN, radial, median, ulnar, musculocutaneous, axillary, suprascapular, long thoracic, and spinal accessory innervated muscles. No abnormal sensibility  ?Vascular/Lymphatic    ?Radial Pulse  2+ 2+  ?Cervical Exam    ?Patient has symmetric cervical range of motion with negative Spurling's test.  ?Special Test: Positive Spurling test  ? ? ? ?Imaging:   ?Xray (3 views right shoulder, 3 views left shoulder) ?Mild glenohumeral changes overall well-preserved joint spaces bilaterally ? ?I  personally reviewed and interpreted the radiographs. ? ? ?Assessment:   ?61 y.o. male right-hand-dominant presents with right worse than left shoulder pain going on now for several years.  Previous MRI did show evidence of a partial rotator cuff tear.  At this time he has tried physical therapy and steroid injection with minimal relief.  As result I would recommend an updated MRI as his previous MRI is now nearly a year and a half old, and I would be concerned that there is a progression of his tearing to possibly a full rotator cuff tear.  I do believe that this would be important to further assess this to give him a better surgical opinion.  This is true particularly given the fact that he has failed conservative management at this time.  With regard to the left shoulder I do believe this is more consistent with a rotator cuff tendinitis.  We will plan to begin with treatment of the right shoulder this time. ? ?Plan :   ? ?- Plan for right shoulder MRI ? ?I believe that advance imaging in the form of an MRI is indicated for the following reasons: ?-Xrays images were obtained and not diagnostic ?-The patient has failed treatment modalities including rest, physical therapy, injections, anti-inflammatories ?-The following worrisome symptoms are present on history and exam: Weakness with abduction, positive ? ? ? ? ? ? ?I personally saw and evaluated the patient, and participated in the management and treatment plan. ? ?Huel CoteSteven Lalo Tromp, MD ?Attending Physician, Orthopedic Surgery ? ?This document was dictated using Conservation officer, historic buildingsDragon voice recognition software. A reasonable attempt at proof reading has been made to minimize errors. ?

## 2021-06-08 ENCOUNTER — Ambulatory Visit
Admission: RE | Admit: 2021-06-08 | Discharge: 2021-06-08 | Disposition: A | Discharge status: Home / Self Care | Payer: BC Managed Care – PPO | Source: Ambulatory Visit | Attending: Orthopaedic Surgery | Admitting: Orthopaedic Surgery

## 2021-06-08 DIAGNOSIS — G8929 Other chronic pain: Secondary | ICD-10-CM | POA: Diagnosis not present

## 2021-06-08 DIAGNOSIS — M25411 Effusion, right shoulder: Secondary | ICD-10-CM | POA: Diagnosis not present

## 2021-06-08 DIAGNOSIS — M19011 Primary osteoarthritis, right shoulder: Secondary | ICD-10-CM | POA: Diagnosis not present

## 2021-06-12 DIAGNOSIS — E119 Type 2 diabetes mellitus without complications: Secondary | ICD-10-CM | POA: Diagnosis not present

## 2021-06-28 ENCOUNTER — Other Ambulatory Visit (HOSPITAL_COMMUNITY): Payer: Self-pay

## 2021-06-29 ENCOUNTER — Encounter (HOSPITAL_BASED_OUTPATIENT_CLINIC_OR_DEPARTMENT_OTHER): Payer: Self-pay | Admitting: Family Medicine

## 2021-06-29 ENCOUNTER — Ambulatory Visit (HOSPITAL_BASED_OUTPATIENT_CLINIC_OR_DEPARTMENT_OTHER): Payer: BC Managed Care – PPO | Admitting: Family Medicine

## 2021-06-29 VITALS — BP 104/77 | HR 85 | Ht 66.0 in | Wt 137.7 lb

## 2021-06-29 DIAGNOSIS — Z0000 Encounter for general adult medical examination without abnormal findings: Secondary | ICD-10-CM

## 2021-06-29 DIAGNOSIS — Z794 Long term (current) use of insulin: Secondary | ICD-10-CM | POA: Diagnosis not present

## 2021-06-29 DIAGNOSIS — I252 Old myocardial infarction: Secondary | ICD-10-CM | POA: Diagnosis not present

## 2021-06-29 DIAGNOSIS — E1121 Type 2 diabetes mellitus with diabetic nephropathy: Secondary | ICD-10-CM | POA: Diagnosis not present

## 2021-06-29 LAB — HEMOGLOBIN A1C
Est. average glucose Bld gHb Est-mCnc: 174 mg/dL
Hgb A1c MFr Bld: 7.7 % — ABNORMAL HIGH (ref 4.8–5.6)

## 2021-06-29 MED ORDER — TRESIBA FLEXTOUCH 100 UNIT/ML ~~LOC~~ SOPN: 36.0000 [IU] | PEN_INJECTOR | Freq: Every day | SUBCUTANEOUS | 1 refills | Status: DC

## 2021-06-29 MED ORDER — JARDIANCE 25 MG PO TABS: 25.0000 mg | ORAL_TABLET | Freq: Every day | ORAL | 1 refills | Status: DC

## 2021-06-29 MED ORDER — METFORMIN HCL ER 500 MG PO TB24: 1000.0000 mg | ORAL_TABLET | Freq: Every morning | ORAL | 1 refills | Status: DC

## 2021-06-29 NOTE — Assessment & Plan Note (Signed)
Patient continues with Jardiance, metformin, Tresiba 36 units daily, Ozempic 2 mg weekly.  Denies any issues with hypoglycemia.  We attempted to check A1c at last office visit, however POC machine was not working Requesting refills of some medications today, sent to pharmacy He does recall having pneumonia vaccine in the past.  He does follow with an eye doctor, indicates that he does go once a year for screening We will check hemoglobin A1c today Plan to complete foot exam at upcoming CPE

## 2021-06-29 NOTE — Progress Notes (Signed)
    Procedures performed today:    None.  Independent interpretation of notes and tests performed by another provider:   None.  Brief History, Exam, Impression, and Recommendations:    BP 104/77   Pulse 85   Ht 5\' 6"  (1.676 m)   Wt 137 lb 11.2 oz (62.5 kg)   SpO2 100%   BMI 22.23 kg/m   Diabetes mellitus (HCC) Patient continues with Jardiance, metformin, Tresiba 36 units daily, Ozempic 2 mg weekly.  Denies any issues with hypoglycemia.  We attempted to check A1c at last office visit, however POC machine was not working Requesting refills of some medications today, sent to pharmacy He does recall having pneumonia vaccine in the past.  He does follow with an eye doctor, indicates that he does go once a year for screening We will check hemoglobin A1c today Plan to complete foot exam at upcoming CPE  History of non-ST elevation myocardial infarction (NSTEMI) Patient continues with ASA 81, atorvastatin, metoprolol.  He has not scheduled follow-up with cardiology as of yet.  Recommend that he reach out to their office to schedule I year follow-up, last visit with them was March 2022  Plan for follow-up in about 3 months for CPE with labs a few days prior   ___________________________________________ Christopher Cooper de April 2022, MD, ABFM, CAQSM Primary Care and Sports Medicine [facility]

## 2021-06-29 NOTE — Assessment & Plan Note (Signed)
Patient continues with ASA 81, atorvastatin, metoprolol.  He has not scheduled follow-up with cardiology as of yet.  Recommend that he reach out to their office to schedule I year follow-up, last visit with them was March 2022

## 2021-06-29 NOTE — Patient Instructions (Signed)
  Medication Instructions:  Your physician recommends that you continue on your current medications as directed. Please refer to the Current Medication list given to you today. --If you need a refill on any your medications before your next appointment, please call your pharmacy first. If no refills are authorized on file call the office.-- Lab Work: Your physician has recommended that you have lab work today: Yes If you have labs (blood work) drawn today and your tests are completely normal, you will receive your results via MyChart message OR a phone call from our staff.  Please ensure you check your voicemail in the event that you authorized detailed messages to be left on a delegated number. If you have any lab test that is abnormal or we need to change your treatment, we will call you to review the results.  Referrals/Procedures/Imaging: No  Follow-Up: Your next appointment:   Your physician recommends that you schedule a follow-up appointment in 3 months for cpe with Dr. de [place].  You will receive a text message or e-mail with a link to a survey about your care and experience with [place] today! We would greatly appreciate your feedback!   Thanks for letting us be apart of your health journey!!  Primary Care and Sports Medicine   Dr. Ceasar Mons Peru   We encourage you to activate your patient portal called "MyChart".  Sign up information is provided on this After Visit Summary.  MyChart is used to connect with patients for Virtual Visits (Telemedicine).  Patients are able to view lab/test results, encounter notes, upcoming appointments, etc.  Non-urgent messages can be sent to your provider as well. To learn more about what you can do with MyChart, please visit --  [email or web address].

## 2021-07-05 ENCOUNTER — Other Ambulatory Visit (HOSPITAL_BASED_OUTPATIENT_CLINIC_OR_DEPARTMENT_OTHER): Payer: Self-pay

## 2021-07-05 ENCOUNTER — Telehealth (HOSPITAL_BASED_OUTPATIENT_CLINIC_OR_DEPARTMENT_OTHER): Payer: Self-pay | Admitting: Family Medicine

## 2021-07-05 ENCOUNTER — Ambulatory Visit (HOSPITAL_BASED_OUTPATIENT_CLINIC_OR_DEPARTMENT_OTHER): Payer: Self-pay | Admitting: Orthopaedic Surgery

## 2021-07-05 ENCOUNTER — Ambulatory Visit (HOSPITAL_BASED_OUTPATIENT_CLINIC_OR_DEPARTMENT_OTHER): Payer: BC Managed Care – PPO | Admitting: Orthopaedic Surgery

## 2021-07-05 DIAGNOSIS — M75111 Incomplete rotator cuff tear or rupture of right shoulder, not specified as traumatic: Secondary | ICD-10-CM | POA: Diagnosis not present

## 2021-07-05 MED ORDER — ASPIRIN 325 MG PO TBEC
325.0000 mg | DELAYED_RELEASE_TABLET | Freq: Every day | ORAL | 0 refills | Status: DC
  Filled 2021-07-05: qty 30, 30d supply, fill #0

## 2021-07-05 MED ORDER — ACETAMINOPHEN 500 MG PO TABS
500.0000 mg | ORAL_TABLET | Freq: Three times a day (TID) | ORAL | 0 refills | Status: AC
  Filled 2021-07-05: qty 30, 10d supply, fill #0

## 2021-07-05 MED ORDER — OXYCODONE HCL 5 MG PO TABS
5.0000 mg | ORAL_TABLET | ORAL | 0 refills | Status: DC | PRN
  Filled 2021-07-05: qty 20, 4d supply, fill #0

## 2021-07-05 NOTE — Telephone Encounter (Signed)
Received Surgery clearance paperwork on 5/24 for pt for R Shoulder Arthroscopy & Rotator cuff repair. Pt was just seen on 5/18. Please let scheduling know if we need to schedule a surgery cleanse appt to fill out this paperwork or if this was discussed at appt on 5/18. (Surgery not yet scheduled)  Paperwork is in providers RED tray. Please advise.

## 2021-07-05 NOTE — Progress Notes (Signed)
Chief Complaint: right greater than left shoulder pain     History of Present Illness:   07/05/2021: Presents today for follow-up of his right shoulder.  He continues to have pain.  He continues to have weakness with overhead activity.  This is significantly limiting him at his job at [employer].  At this time he is also having significant difficulty sleeping and laying on the side with waking up every 30 minutes.  Christopher Cooper is a 61 y.o. male hand dominant male presents with right worse than left shoulder pain which has now been going on for 2 years at least.  He states that the right shoulder has been bothering significantly more.  He does state that approximately 2 years ago he was sitting down and went to reach behind the back and felt a significant sharp pain.  Since that time he has had limited motion and pain overhead.  He has pain when he lies directly on the side.  He has pain that radiates down to the lateral aspect of the arm when he is laying on that side or with overhead activity.  He does feel weak overhead.  He previously has been treated at [facility] orthopedics where he was referred for physical therapy for multiple months.  This he states felt like it overall made her shoulder worse.  He did have 1 injection performed as well a year prior which gave him little to no relief.  He feels like he is back to baseline at this point.  He smokes approximately 1 pack of cigarettes a month.  He does have type 2 diabetes.  He does also have a history of a cardiac stent.    Surgical History:   None  PMH/PSH/Family History/Social History/Meds/Allergies:    Past Medical History:  Diagnosis Date   Asbestos exposure    CAD in native artery    Diabetes (HCC)    GERD (gastroesophageal reflux disease)    Hyperlipidemia LDL goal <70    Hypertension    S/P angioplasty with stent, 11/25/15 to mRCA to dRCA promus premier DES 11/26/2015   Past Surgical  History:  Procedure Laterality Date   ANKLE ARTHROSCOPY Left    after injury   CARDIAC CATHETERIZATION N/A 11/25/2015   Procedure: Left Heart Cath and Coronary Angiography;  Surgeon: Peter M Swaziland, MD;  Location: [facility] INVASIVE CV LAB;  Service: Cardiovascular;  Laterality: N/A;   CARDIAC CATHETERIZATION N/A 11/25/2015   Procedure: Coronary Stent Intervention;  Surgeon: Peter M Swaziland, MD;  Location: [facility] INVASIVE CV LAB;  Service: Cardiovascular;  Laterality: N/A;   CORONARY ANGIOPLASTY WITH STENT PLACEMENT     Social History   Socioeconomic History   Marital status: Married    Spouse name: Not on file   Number of children: Not on file   Years of education: Not on file   Highest education level: Not on file  Occupational History   Occupation: Social research officer, government at [employer]  Tobacco Use   Smoking status: Former    Packs/day: 0.25    Types: Cigarettes   Smokeless tobacco: Never  Substance and Sexual Activity   Alcohol use: No   Drug use: No   Sexual activity: Yes    Birth control/protection: Other-see comments  Other Topics Concern   Not on file  Social History  Narrative   [place] lives in [place].   Social Determinants of Health   Financial Resource Strain: Not on file  Food Insecurity: Not on file  Transportation Needs: Not on file  Physical Activity: Not on file  Stress: Not on file  Social Connections: Not on file   Family History  Problem Relation Age of Onset   Diabetes Mellitus II Mother    Diabetes Mellitus II Father    CAD Other        Reports multiple distant relatives with history of MI   No Known Allergies Current Outpatient Medications  Medication Sig Dispense Refill   aspirin EC 81 MG tablet Take 81 mg by mouth daily.     atorvastatin (LIPITOR) 80 MG tablet TAKE 1 TABLET BY MOUTH DAILY AT 6 PM (Patient taking differently: Take 80 mg by mouth.) 90 tablet 0   B-D ULTRAFINE III SHORT PEN 31G X 8 MM MISC USE AS DIRECTED EVERY DAY     calcium  carbonate (TUMS - DOSED IN MG ELEMENTAL CALCIUM) 500 MG chewable tablet Chew 1 tablet by mouth daily.     insulin degludec (TRESIBA FLEXTOUCH) 100 UNIT/ML FlexTouch Pen Inject 36 Units into the skin daily. 15 mL 1   JARDIANCE 25 MG TABS tablet Take 1 tablet (25 mg total) by mouth daily. 90 tablet 1   metFORMIN (GLUCOPHAGE-XR) 500 MG 24 hr tablet Take 2 tablets (1,000 mg total) by mouth every morning. 180 tablet 1   metoprolol succinate (TOPROL XL) 25 MG 24 hr tablet Take 1 tablet (25 mg total) by mouth daily. 90 tablet 3   nitroGLYCERIN (NITROSTAT) 0.4 MG SL tablet Place 1 tablet (0.4 mg total) under the tongue every 5 (five) minutes as needed for chest pain. 30 tablet 12   Omeprazole (PRILOSEC PO) Take by mouth.     ONETOUCH VERIO test strip AS DIRECTED TO CHECK BLOOD GLUCOSE THREE TIMES DAILY. Z79.4.     Semaglutide, 2 MG/DOSE, (OZEMPIC, 2 MG/DOSE,) 8 MG/3ML SOPN Inject 2 mg subcutaneous once a week 12 mL 3   VASCEPA 1 g CAPS Take 2 capsules by mouth 2 (two) times daily.     No current facility-administered medications for this visit.   No results found.  Review of Systems:   A ROS was performed including pertinent positives and negatives as documented in the HPI.  Physical Exam :   Constitutional: NAD and appears stated age Neurological: Alert and oriented Psych: Appropriate affect and cooperative There were no vitals taken for this visit.   Comprehensive Musculoskeletal Exam:    Musculoskeletal Exam    Inspection Right Left  Skin No atrophy or winging No atrophy or winging  Palpation    Tenderness Lateral deltoid Lateral deltoid  Range of Motion    Flexion (passive) 150 170  Flexion (active) 140 170  Abduction 140 170  ER at the side 50 70  Can reach behind back to L3 T12  Strength     4/5 supra and infraspinatus, positive belly press Full with pain  Special Tests    Pseudoparalytic No No  Neurologic    Fires PIN, radial, median, ulnar, musculocutaneous, axillary,  suprascapular, long thoracic, and spinal accessory innervated muscles. No abnormal sensibility  Vascular/Lymphatic    Radial Pulse 2+ 2+  Cervical Exam    Patient has symmetric cervical range of motion with negative Spurling's test.  Special Test: Positive Spurling test     Imaging:   Xray (3 views right  shoulder, 3 views left shoulder) Mild glenohumeral changes overall well-preserved joint spaces bilaterally  MRI right shoulder: There is a approximately 90% articular sided rotator cuff tear involving the supraspinatus with fluid in the biceps sheath  I personally reviewed and interpreted the radiographs.   Assessment:   61 y.o. male right-hand-dominant presents with right worse than left shoulder pain going on now for several years.  MRI of the right shoulder does show evidence of a near full-thickness rotator cuff tear on the right supraspinatus.  At today's visit we discussed typical progression of rotator cuff injuries.  I did describe the risk of progression from partial-thickness to full-thickness tearing.  At this time he has trialed physical therapy with multiple months of treatment and no improvement.  This seems to have aggravated or made the shoulder worse.  At this time he is also concerned about the left shoulder as he feels he is overusing it as result of the right shoulder weakness.  He has had an injection in the previous year which gave him very little relief.  At today's visit he is quite concerned about his weakness inability to lay on the side.  I did discuss additional treatment options including continued therapy or an additional injection.  He is less willing to pursue this as these have historically not been helpful.  We did discuss the role for arthroscopic shoulder rotator cuff repair.  Given the fact he does have a near full-thickness tear I would ultimately recommend repair.  After discussion of the specific risks and benefits he would like to proceed with this.  We  did discuss how his healing rate may ultimately be impacted given the fact that he is smoking approximately 1 pack of cigarettes per day.  He was counseled on this.  Plan :    - Plan for right shoulder arthroscopy with rotator cuff repair    After a lengthy discussion of treatment options, including risks, benefits, alternatives, complications of surgical and nonsurgical conservative options, the patient elected surgical repair.   The patient  is aware of the material risks  and complications including, but not limited to injury to adjacent structures, neurovascular injury, infection, numbness, bleeding, implant failure, thermal burns, stiffness, persistent pain, failure to heal, disease transmission from allograft, need for further surgery, dislocation, anesthetic risks, blood clots, risks of death,and others. The probabilities of surgical success and failure discussed with patient given their particular co-morbidities.The time and nature of expected rehabilitation and recovery was discussed.The patient's questions were all answered preoperatively.  No barriers to understanding were noted. I explained the natural history of the disease process and Rx rationale.  I explained to the patient what I considered to be reasonable expectations given their personal situation.  The final treatment plan was arrived at through a shared patient decision making process model.        I personally saw and evaluated the patient, and participated in the management and treatment plan.  Huel Cote, MD Attending Physician, Orthopedic Surgery  This document was dictated using Dragon voice recognition software. A reasonable attempt at proof reading has been made to minimize errors.

## 2021-07-05 NOTE — Telephone Encounter (Signed)
Called pt and scheduled surgery clearance appt for 5/30

## 2021-07-11 ENCOUNTER — Ambulatory Visit (HOSPITAL_BASED_OUTPATIENT_CLINIC_OR_DEPARTMENT_OTHER): Payer: BC Managed Care – PPO | Admitting: Family Medicine

## 2021-07-11 ENCOUNTER — Encounter (HOSPITAL_BASED_OUTPATIENT_CLINIC_OR_DEPARTMENT_OTHER): Payer: Self-pay | Admitting: Family Medicine

## 2021-07-11 DIAGNOSIS — E1121 Type 2 diabetes mellitus with diabetic nephropathy: Secondary | ICD-10-CM | POA: Diagnosis not present

## 2021-07-11 DIAGNOSIS — I252 Old myocardial infarction: Secondary | ICD-10-CM | POA: Diagnosis not present

## 2021-07-11 DIAGNOSIS — Z794 Long term (current) use of insulin: Secondary | ICD-10-CM

## 2021-07-11 DIAGNOSIS — Z01818 Encounter for other preprocedural examination: Secondary | ICD-10-CM | POA: Insufficient documentation

## 2021-07-11 LAB — CBC WITH DIFFERENTIAL/PLATELET
Basophils Absolute: 0.1 10*3/uL (ref 0.0–0.2)
Basos: 1 %
EOS (ABSOLUTE): 0.3 10*3/uL (ref 0.0–0.4)
Eos: 3 %
Hematocrit: 45.2 % (ref 37.5–51.0)
Hemoglobin: 15.2 g/dL (ref 13.0–17.7)
Immature Grans (Abs): 0 10*3/uL (ref 0.0–0.1)
Immature Granulocytes: 0 %
Lymphocytes Absolute: 2.7 10*3/uL (ref 0.7–3.1)
Lymphs: 29 %
MCH: 29.9 pg (ref 26.6–33.0)
MCHC: 33.6 g/dL (ref 31.5–35.7)
MCV: 89 fL (ref 79–97)
Monocytes Absolute: 0.7 10*3/uL (ref 0.1–0.9)
Monocytes: 7 %
Neutrophils Absolute: 5.6 10*3/uL (ref 1.4–7.0)
Neutrophils: 60 %
Platelets: 221 10*3/uL (ref 150–450)
RBC: 5.09 x10E6/uL (ref 4.14–5.80)
RDW: 12.6 % (ref 11.6–15.4)
WBC: 9.4 10*3/uL (ref 3.4–10.8)

## 2021-07-11 LAB — BASIC METABOLIC PANEL
BUN/Creatinine Ratio: 14 (ref 9–20)
BUN: 17 mg/dL (ref 6–24)
CO2: 21 mmol/L (ref 20–29)
Calcium: 9.2 mg/dL (ref 8.7–10.2)
Chloride: 103 mmol/L (ref 96–106)
Creatinine, Ser: 1.24 mg/dL (ref 0.76–1.27)
Glucose: 78 mg/dL (ref 70–99)
Potassium: 4.1 mmol/L (ref 3.5–5.2)
Sodium: 138 mmol/L (ref 134–144)
eGFR: 67 mL/min/{1.73_m2} (ref >59)

## 2021-07-11 NOTE — Progress Notes (Signed)
    Procedures performed today:    None.  Independent interpretation of notes and tests performed by another provider:   None.  Brief History, Exam, Impression, and Recommendations:    Christopher Cooper is a 61 y.o. presenting for preoperative evaluation/clearance. The planned procedure is right shoulder arthroscopy with rotator cuff repair with the treatment goal of decreased pain and improved function. Cardiac risk for planned procedure is Intermediate (1 to 5%) - intraperitoneal or intrathoracic surgery, carotid endarterectomy, head and neck surgery, orthopedic surgery, prostate surgery  Signs or symptoms of cardiovascular disease? No New or unstable cardiopulmonary signs or symptoms? No Urinary symptoms or undergoing surgical implantation of foreign material (prosthetic joint, heart valve) or invasive urologic procedure? No  BP 113/76   Pulse 83   Temp 97.8 F (36.6 C)   Resp 18   Ht 5\' 6"  (1.676 m)   Wt 139 lb 6.4 oz (63.2 kg)   SpO2 100%   BMI 22.50 kg/m   Exam: Patient is in no acute distress, vital signs stable Cardiovascular exam with regular rate and rhythm, no murmur appreciated Lungs clear to auscultation bilaterally  Preoperative clearance 61 year old male with history of diabetes and NSTEMI needing clearance for upcoming surgery. Patient is currently without any cardiopulmonary symptoms, no urinary symptoms.  He has not had recent labs and thus recommend obtaining CBC and BMP today.  Do not feel that other evaluation is needed at this time due to lack of current symptoms, no plans for implanted hardware. He does have history of NSTEMI and is overdue for follow-up with cardiology and thus feel that follow-up with cardiology is needed prior to proceeding with surgery, I advised him of this.  He indicates that he does have their contact information and will reach out to their office to schedule this  Based on history and exam will order the following preoperative  tests: CBC, BMP  Medical clearance for surgery pending results of above tests. Once received, will review and make determination of medical clearance. Patient also needs follow-up with Cardiology - he is overdue for follow-up related to NSTEMI and would need clearance from them prior to surgery   ___________________________________________ Angelisa Winthrop de [place], MD, ABFM, CA[facility] [place]  CBC and BMP completed, lab results are normal.  From a medical standpoint, patient is cleared for surgery, patient still requires follow-up/clearance from cardiology due to history of recent NSTEMI and being overdue for recommended follow-up with cardiology.

## 2021-07-11 NOTE — Assessment & Plan Note (Signed)
61 year old male with history of diabetes and NSTEMI needing clearance for upcoming surgery. Patient is currently without any cardiopulmonary symptoms, no urinary symptoms.  He has not had recent labs and thus recommend obtaining CBC and BMP today.  Do not feel that other evaluation is needed at this time due to lack of current symptoms, no plans for implanted hardware. He does have history of NSTEMI and is overdue for follow-up with cardiology and thus feel that follow-up with cardiology is needed prior to proceeding with surgery, I advised him of this.  He indicates that he does have their contact information and will reach out to their office to schedule this

## 2021-07-13 ENCOUNTER — Telehealth: Payer: Self-pay

## 2021-07-13 NOTE — Telephone Encounter (Signed)
   Pre-operative Risk Assessment    Patient Name: Christopher Cooper  DOB: 20-Sep-1960 MRN: [id number]      Request for Surgical Clearance    Procedure:   Right shoulder arthroscopy with Rotator cuff repair  Date of Surgery:  Clearance TBD                                 Surgeon:  Criss Alvine, MD Surgeon's Group or Practice Name:  [facility] number:  [phone] Fax number:  [phone]   Type of Clearance Requested:   - Medical    Type of Anesthesia:   Regional block   Additional requests/questions:    SignedChana Bode   07/13/2021, 1:14 PM

## 2021-07-13 NOTE — Telephone Encounter (Signed)
   Name: ROALD LUKACS  DOB: 06/29/1960  MRN: [id number]  Primary Cardiologist: Peter Swaziland, MD  Chart reviewed as part of pre-operative protocol coverage. Because of Rodarius Kichline Pertuit's past medical history and time since last visit, he will require a follow-up in-office visit in order to better assess preoperative cardiovascular risk.  Pre-op covering staff: - Please schedule appointment and call patient to inform them. If patient already had an upcoming appointment within acceptable timeframe, please add "pre-op clearance" to the appointment notes so provider is aware. - Please contact requesting surgeon's office via preferred method (i.e, phone, fax) to inform them of need for appointment prior to surgery.  [place], [place]  07/13/2021, 11:29 PM

## 2021-07-14 NOTE — Telephone Encounter (Signed)
Pt has been scheduled to see Edd Fabian, NP, 07/21/21, clearance will be addressed at that time.  Will route back to the requesting surgeon's office to make them aware.

## 2021-07-18 NOTE — Progress Notes (Deleted)
Cardiology Clinic Note   Patient Name: Christopher Cooper Radel Date of Encounter: 07/18/2021  Primary Care Provider:  [name], [name], MD Primary Cardiologist:  Peter SwazilandJordan, MD  Patient Profile    Christopher Cooper 61 year old male presents to clinic today for follow-up evaluation of his coronary artery disease and preoperative cardiac evaluation. Past Medical History    Past Medical History:  Diagnosis Date   Asbestos exposure    CAD in native artery    Diabetes (HCC)    GERD (gastroesophageal reflux disease)    Hyperlipidemia LDL goal <70    Hypertension    S/P angioplasty with stent, 11/25/15 to mRCA to dRCA promus premier DES 11/26/2015   Past Surgical History:  Procedure Laterality Date   ANKLE ARTHROSCOPY Left    after injury   CARDIAC CATHETERIZATION N/A 11/25/2015   Procedure: Left Heart Cath and Coronary Angiography;  Surgeon: Peter M SwazilandJordan, MD;  [facility];  Service: Cardiovascular;  Laterality: N/A;   CARDIAC CATHETERIZATION N/A 11/25/2015   Procedure: Coronary Stent Intervention;  Surgeon: Peter M SwazilandJordan, MD;  [facility];  Service: Cardiovascular;  Laterality: N/A;   CORONARY ANGIOPLASTY WITH STENT PLACEMENT      Allergies  No Known Allergies  History of Present Illness    Christopher Cooper has a PMH of GERD, tobacco use, diabetes, remote asbestos exposure, and coronary artery disease.  He presented to the hospital 10/17 for chest discomfort and was ruled in for NSTEMI.  He underwent cardiac catheterization 10/17 which showed 30% proximal LAD, 30% OM 1, 90% mid-distal RCA which was treated with PCI and DES.  His EF was noted to be 55-60%.  His echocardiogram 11/26/2015 showed an LVEF of 55 to 60% and no regional wall motion abnormalities.  He was not noted to have an significant valvular abnormalities.  He followed up with Dr. SwazilandJordan on 04/14/2020.  During that time he was doing well.  He denied chest pain dyspnea palpitations and  dizziness.  He was tolerating his medications well.  He continued to smoke 2-3 cigarettes/week.  He presents to the clinic today for follow-up evaluation and preoperative cardiac evaluation.  He states***  *** denies chest pain, shortness of breath, lower extremity edema, fatigue, palpitations, melena, hematuria, hemoptysis, diaphoresis, weakness, presyncope, syncope, orthopnea, and PND.   Home Medications    Prior to Admission medications   Medication Sig Start Date End Date Taking? Authorizing Provider  aspirin EC 325 MG tablet Take 1 tablet (325 mg total) by mouth daily. 07/05/21   Huel CoteBokshan, Steven, MD  aspirin EC 81 MG tablet Take 81 mg by mouth daily.    [provider]  atorvastatin (LIPITOR) 80 MG tablet TAKE 1 TABLET BY MOUTH DAILY AT 6 PM Patient taking differently: Take 80 mg by mouth. 12/02/18   SwazilandJordan, Peter M, MD  B-Cooper ULTRAFINE III SHORT PEN 31G X 8 MM MISC USE AS DIRECTED EVERY DAY 12/06/18   [provider]  calcium carbonate (TUMS - DOSED IN MG ELEMENTAL CALCIUM) 500 MG chewable tablet Chew 1 tablet by mouth daily.    [provider]  insulin degludec (TRESIBA FLEXTOUCH) 100 UNIT/ML FlexTouch Pen Inject 36 Units into the skin daily. 06/29/21   [name], Raymond J, MD  JARDIANCE 25 MG TABS tablet Take 1 tablet (25 mg total) by mouth daily. 06/29/21   [name], [name], MD  metFORMIN (GLUCOPHAGE-XR) 500 MG 24 hr tablet Take 2 tablets (1,000  mg total) by mouth every morning. 06/29/21   [place], Buren Kos, MD  metoprolol succinate (TOPROL XL) 25 MG 24 hr tablet Take 1 tablet (25 mg total) by mouth daily. 03/22/21   Swaziland, Peter M, MD  nitroGLYCERIN (NITROSTAT) 0.4 MG SL tablet Place 1 tablet (0.4 mg total) under the tongue every 5 (five) minutes as needed for chest pain. 11/26/15   Sheikh, Omair Latif, DO  Omeprazole (PRILOSEC PO) Take by mouth.    [provider]  ONETOUCH VERIO test strip AS DIRECTED TO CHECK BLOOD GLUCOSE THREE TIMES DAILY. Z79.4.  11/01/18   [provider]  oxyCODONE (OXY IR/ROXICODONE) 5 MG immediate release tablet Take 1 tablet (5 mg total) by mouth every 4 (four) hours as needed (severe pain). 07/05/21   Huel Cote, MD  Semaglutide, 2 MG/DOSE, (OZEMPIC, 2 MG/DOSE,) 8 MG/3ML SOPN Inject 2 mg subcutaneous once a week 01/19/21     VASCEPA 1 g CAPS Take 2 capsules by mouth 2 (two) times daily. 11/17/18   [provider]    Family History    Family History  Problem Relation Age of Onset   Diabetes Mellitus II Mother    Diabetes Mellitus II Father    CAD Other        Reports multiple distant relatives with history of MI   He indicated that his mother is alive. He indicated that his father is alive. He indicated that the status of his other is unknown.  Social History    Social History   Socioeconomic History   Marital status: Married    Spouse name: Not on file   Number of children: Not on file   Years of education: Not on file   Highest education level: Not on file  Occupational History   Occupation: Social research officer, government at [employer]  Tobacco Use   Smoking status: Former    Packs/day: 0.25    Types: Cigarettes   Smokeless tobacco: Never  Substance and Sexual Activity   Alcohol use: No   Drug use: No   Sexual activity: Yes    Birth control/protection: Other-see comments  Other Topics Concern   Not on file  Social History Narrative   He lives in [place] with his wife.   Social Determinants of Health   Financial Resource Strain: Not on file  Food Insecurity: Not on file  Transportation Needs: Not on file  Physical Activity: Not on file  Stress: Not on file  Social Connections: Not on file  Intimate Partner Violence: Not on file     Review of Systems    General:  No chills, fever, night sweats or weight changes.  Cardiovascular:  No chest pain, dyspnea on exertion, edema, orthopnea, palpitations, paroxysmal nocturnal dyspnea. Dermatological: No rash,  lesions/masses Respiratory: No cough, dyspnea Urologic: No hematuria, dysuria Abdominal:   No nausea, vomiting, diarrhea, bright red blood per rectum, melena, or hematemesis Neurologic:  No visual changes, wkns, changes in mental status. All other systems reviewed and are otherwise negative except as noted above.  Physical Exam    VS:  There were no vitals taken for this visit. , BMI There is no height or weight on file to calculate BMI. GEN: Well nourished, well developed, in no acute distress. HEENT: normal. Neck: Supple, no JVD, carotid bruits, or masses. Cardiac: RRR, no murmurs, rubs, or gallops. No clubbing, cyanosis, edema.  Radials/DP/PT 2+ and equal bilaterally.  Respiratory:  Respirations regular and unlabored, clear to auscultation bilaterally. GI: Soft,  nontender, nondistended, BS + x 4. MS: no deformity or atrophy. Skin: warm and dry, no rash. Neuro:  Strength and sensation are intact. Psych: Normal affect.  Accessory Clinical Findings    Recent Labs: 07/11/2021: BUN 17; Creatinine, Ser 1.24; Hemoglobin 15.2; Platelets 221; Potassium 4.1; Sodium 138   Recent Lipid Panel    Component Value Date/Time   CHOL 74 01/16/2016 0802   TRIG 143 01/16/2016 0802   HDL 34 (L) 01/16/2016 0802   CHOLHDL 2.2 01/16/2016 0802   VLDL 29 01/16/2016 0802   LDLCALC 11 01/16/2016 0802    ECG personally reviewed by me today- *** - No acute changes  Echocardiogram 11/26/2015  Study Conclusions   - Left ventricle: The cavity size was normal. Wall thickness was    normal. Systolic function was normal. The estimated ejection    fraction was in the range of 55% to 60%. Wall motion was normal;    there were no regional wall motion abnormalities. Left    ventricular diastolic function parameters were normal.  - Aortic valve: There was no stenosis.  - Mitral valve: There was no significant regurgitation.  - Right ventricle: The cavity size was normal. Systolic function    was normal.   - Pulmonary arteries: No complete TR doppler jet so unable to    estimate PA systolic pressure.  - Inferior vena cava: The vessel was normal in size. The    respirophasic diameter changes were in the normal range (>= 50%),    consistent with normal central venous pressure.   Impressions:   - Normal study.  Cardiac catheterization 11/25/2015  Prox LAD to Mid LAD lesion, 30 %stenosed. Ost 1st Mrg to 1st Mrg lesion, 30 %stenosed. The left ventricular systolic function is normal. LV end diastolic pressure is normal. The left ventricular ejection fraction is 55-65% by visual estimate. A STENT PROMUS PREM MR 3.0X16 drug eluting stent was successfully placed. Mid RCA to Dist RCA lesion, 90 %stenosed. Post intervention, there is a 0% residual stenosis.   1. Single vessel obstructive CAD with ulcerated plaque/stenosis in the distal RCA. 2. Normal LV function 3. Normal LV EDP. 4. Successful stenting of the distal RCA with a DES.   Plan: DAPT for one year. Aggressive risk factor modification. Anticipate DC tomorrow.   Diagnostic Dominance: Right Intervention   Assessment & Plan   1.  Coronary artery disease-no recent episodes of arm neck back or chest discomfort.  Underwent cardiac catheterization with PCI and DES to his mid-distal RCA 11/25/2015.  Details above. Continue aspirin, atorvastatin, Jardiance, metformin, metoprolol, Vascepa Heart healthy low-sodium diet-salty 6 given Increase physical activity as tolerated   Hyperlipidemia-LDL*** Continue atorvastatin, aspirin, Vascepa Heart healthy low-sodium high-fiber diet Increase physical activity as tolerated Repeat fasting lipids and LFTs  Essential hypertension-BP today***.  Well-controlled at home. Continue metoprolol Heart healthy low-sodium diet-salty 6 given Increase physical activity as tolerated    Preoperative cardiac evaluation-right shoulder arthroscopy with rotator cuff repair, Dr. Christiana Pellant  Chart reviewed as  part of pre-operative protocol coverage. Given past medical history and time since last visit, based on ACC/AHA guidelines, ORVIN NETTER would be at acceptable risk for the planned procedure without further cardiovascular testing.   Patient was advised that if he develops new symptoms prior to surgery to contact our office to arrange a follow-up appointment.  He verbalized understanding.  His RCRI is a class III risk, 6.6% risk of major cardiac event.  He is able to complete greater than 4 METS  of physical activity.   Disposition: Follow-up with Dr. Swaziland or me in 12 months.  Thomasene Ripple. Taquita Demby NP-C    07/18/2021, 12:48 PM [facility] [address] Office [phone] Fax [phone]  Notice: This dictation was prepared with Dragon dictation along with smaller phrase technology. Any transcriptional errors that result from this process are unintentional and may not be corrected upon review.  I spent***minutes examining this patient, reviewing medications, and using patient centered shared decision making involving her cardiac care.  Prior to her visit I spent greater than 20 minutes reviewing her past medical history,  medications, and prior cardiac tests.

## 2021-07-21 ENCOUNTER — Ambulatory Visit: Payer: BC Managed Care – PPO | Admitting: General Practice

## 2021-08-10 ENCOUNTER — Ambulatory Visit: Payer: BC Managed Care – PPO | Admitting: Physician Assistant

## 2021-08-15 NOTE — Progress Notes (Unsigned)
Cardiology Clinic Note   Patient Name: Christopher Cooper Date of Encounter: 08/16/2021  Primary Care Provider:  de [place], Buren Kos, MD Primary Cardiologist:  Peter Swaziland, MD  Patient Profile    61 year old male with known history of coronary artery disease.  NSTEMI on 11/24/2015 with cardiac catheterization on 11/25/2015 revealing a 30% proximal LAD lesion, 30% OM1 lesion, 90% mid to distal RCA lesion treated with Promus Premier 3.1 x 16 mm DES, EF of 55 to 65% during catheterization.  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  Patient is here for preoperative cardiac evaluation.  Past Medical History    Past Medical History:  Diagnosis Date   Asbestos exposure    CAD in native artery    Diabetes (HCC)    GERD (gastroesophageal reflux disease)    Hyperlipidemia LDL goal <70    Hypertension    S/P angioplasty with stent, 11/25/15 to mRCA to dRCA promus premier DES 11/26/2015   Past Surgical History:  Procedure Laterality Date   ANKLE ARTHROSCOPY Left    after injury   CARDIAC CATHETERIZATION N/A 11/25/2015   Procedure: Left Heart Cath and Coronary Angiography;  Surgeon: Peter M Swaziland, MD;  Location: [facility] INVASIVE CV LAB;  Service: Cardiovascular;  Laterality: N/A;   CARDIAC CATHETERIZATION N/A 11/25/2015   Procedure: Coronary Stent Intervention;  Surgeon: Peter M Swaziland, MD;  Location: [facility] INVASIVE CV LAB;  Service: Cardiovascular;  Laterality: N/A;   CORONARY ANGIOPLASTY WITH STENT PLACEMENT      Allergies  No Known Allergies  History of Present Illness    Christopher Cooper is a 61 year old male with known history of CAD, GERD, ongoing tobacco abuse, hyperlipidemia, and diabetes who presents today for preoperative cardiac evaluation in the setting of rotator cuff repair of the right shoulder with arthroscopy.  Dr. Maryjean Ka of Ortho care at [facility] is requesting medical clearance, with the type of  anesthesia as a regional block.  Christopher Cooper comes today without any cardiac complaints.  He is very active, denies chest pain, dyspnea on exertion, excessive fatigue or dizziness.  He does wear an Apple Watch and wants the Apple watch alarmed him that his heart rate was greater than 120.  He states he had been moving furniture out of the house for couple of hours.  He was asymptomatic from this.  He is medically compliant.  Primary care monitors cholesterol status.  He remains on Vascepa and atorvastatin.  Home Medications    Current Outpatient Medications  Medication Sig Dispense Refill   aspirin EC 81 MG tablet Take 81 mg by mouth daily.     atorvastatin (LIPITOR) 80 MG tablet TAKE 1 TABLET BY MOUTH DAILY AT 6 PM (Patient taking differently: Take 80 mg by mouth.) 90 tablet 0   B-D ULTRAFINE III SHORT PEN 31G X 8 MM MISC USE AS DIRECTED EVERY DAY     calcium carbonate (TUMS - DOSED IN MG ELEMENTAL CALCIUM) 500 MG chewable tablet Chew 1 tablet by mouth daily.     insulin degludec (TRESIBA FLEXTOUCH) 100 UNIT/ML FlexTouch Pen Inject 36 Units into the skin daily. 15 mL 1   JARDIANCE 25 MG TABS tablet Take 1 tablet (25 mg total) by mouth daily. 90 tablet 1   metFORMIN (GLUCOPHAGE-XR) 500 MG 24 hr tablet Take 2 tablets (1,000 mg total) by mouth every morning. 180 tablet 1   metoprolol succinate (TOPROL XL) 25 MG 24 hr tablet Take 1 tablet (25 mg total)  by mouth daily. 90 tablet 3   nitroGLYCERIN (NITROSTAT) 0.4 MG SL tablet Place 1 tablet (0.4 mg total) under the tongue every 5 (five) minutes as needed for chest pain. 30 tablet 12   Omeprazole (PRILOSEC PO) Take by mouth.     ONETOUCH VERIO test strip AS DIRECTED TO CHECK BLOOD GLUCOSE THREE TIMES DAILY. Z79.4.     oxyCODONE (OXY IR/ROXICODONE) 5 MG immediate release tablet Take 1 tablet (5 mg total) by mouth every 4 (four) hours as needed (severe pain). 20 tablet 0   Semaglutide, 2 MG/DOSE, (OZEMPIC, 2 MG/DOSE,) 8 MG/3ML SOPN Inject 2 mg  subcutaneous once a week 12 mL 3   VASCEPA 1 g CAPS Take 2 capsules by mouth 2 (two) times daily.     No current facility-administered medications for this visit.     Family History    Family History  Problem Relation Age of Onset   Diabetes Mellitus II Mother    Diabetes Mellitus II Father    CAD Other        Reports multiple distant relatives with history of MI   He indicated that his mother is alive. He indicated that his father is alive. He indicated that the status of his other is unknown.  Social History    Social History   Socioeconomic History   Marital status: Married    Spouse name: Not on file   Number of children: Not on file   Years of education: Not on file   Highest education level: Not on file  Occupational History   Occupation: Social research officer, government at [employer]  Tobacco Use   Smoking status: Former    Packs/day: 0.25    Types: Cigarettes   Smokeless tobacco: Never  Substance and Sexual Activity   Alcohol use: No   Drug use: No   Sexual activity: Yes    Birth control/protection: Other-see comments  Other Topics Concern   Not on file  Social History Narrative   He lives in [place] with his wife.   Social Determinants of Health   Financial Resource Strain: Not on file  Food Insecurity: Not on file  Transportation Needs: Not on file  Physical Activity: Not on file  Stress: Not on file  Social Connections: Not on file  Intimate Partner Violence: Not on file     Review of Systems    General:  No chills, fever, night sweats or weight changes.  Cardiovascular:  No chest pain, dyspnea on exertion, edema, orthopnea, palpitations, paroxysmal nocturnal dyspnea. Dermatological: No rash, lesions/masses Respiratory: No cough, dyspnea Urologic: No hematuria, dysuria Abdominal:   No nausea, vomiting, diarrhea, bright red blood per rectum, melena, or hematemesis Neurologic:  No visual changes, wkns, changes in mental status. All other systems reviewed and  are otherwise negative except as noted above.     Physical Exam    VS:  BP 100/60   Pulse 80   Ht 5\' 6"  (1.676 m)   Wt 136 lb 9.6 oz (62 kg)   SpO2 98%   BMI 22.05 kg/m  , BMI Body mass index is 22.05 kg/m.     GEN: Well nourished, well developed, in no acute distress. HEENT: normal. Neck: Supple, no JVD, carotid bruits, or masses. Cardiac: RRR, no murmurs, rubs, or gallops. No clubbing, cyanosis, edema.  Radials/DP/PT 2+ and equal bilaterally.  Respiratory:  Respirations regular and unlabored, clear to auscultation bilaterally. GI: Soft, nontender, nondistended, BS + x 4. MS: no deformity or atrophy.  Some soreness in the right shoulder with ROM (extensive examination not completed in the office). Skin: warm and dry, no rash. Neuro:  Strength and sensation are intact. Psych: Normal affect.  Accessory Clinical Findings    ECG personally reviewed by me today-  NSR rate of 80 bpm - No acute changes  Lab Results  Component Value Date   WBC 9.4 07/11/2021   HGB 15.2 07/11/2021   HCT 45.2 07/11/2021   MCV 89 07/11/2021   PLT 221 07/11/2021   Lab Results  Component Value Date   CREATININE 1.24 07/11/2021   BUN 17 07/11/2021   NA 138 07/11/2021   K 4.1 07/11/2021   CL 103 07/11/2021   CO2 21 07/11/2021   Lab Results  Component Value Date   ALT 27 01/16/2016   AST 23 01/16/2016   ALKPHOS 79 01/16/2016   BILITOT 0.7 01/16/2016   Lab Results  Component Value Date   CHOL 74 01/16/2016   HDL 34 (L) 01/16/2016   LDLCALC 11 01/16/2016   TRIG 143 01/16/2016   CHOLHDL 2.2 01/16/2016    Lab Results  Component Value Date   HGBA1C 7.7 (H) 06/29/2021    Review of Prior Studies: Cath 11/25/2015 Prox LAD to Mid LAD lesion, 30 %stenosed. Ost 1st Mrg to 1st Mrg lesion, 30 %stenosed. The left ventricular systolic function is normal. LV end diastolic pressure is normal. The left ventricular ejection fraction is 55-65% by visual estimate. A STENT PROMUS PREM MR 3.0X16  drug eluting stent was successfully placed. Mid RCA to Dist RCA lesion, 90 %stenosed. Post intervention, there is a 0% residual stenosis.   1. Single vessel obstructive CAD with ulcerated plaque/stenosis in the distal RCA. 2. Normal LV function 3. Normal LV EDP. 4. Successful stenting of the distal RCA with a DES.  Echocardiogram 11/26/2015 Study Conclusions   - Left ventricle: The cavity size was normal. Wall thickness was   normal. Systolic function was normal. The estimated ejection   fraction was in the range of 55% to 60%. Wall motion was normal;   there were no regional wall motion abnormalities. Left   ventricular diastolic function parameters were normal. - Aortic valve: There was no stenosis. - Mitral valve: There was no significant regurgitation. - Right ventricle: The cavity size was normal. Systolic function   was normal. - Pulmonary arteries: No complete TR doppler jet so unable to   estimate PA systolic pressure. - Inferior vena cava: The vessel was normal in size. The   respirophasic diameter changes were in the normal range (>= 50%),   consistent with normal central venous pressure.    Assessment & Plan   1.Pre-Operative Cardiac Evaluation: Examination complete and chart reviewed as part of pre-operative protocol coverage. Given past medical history and time since last visit, based on ACC/AHA guidelines, AUSTEN OYSTER would be at acceptable risk for the planned procedure without further cardiovascular testing. Okay to hold ASA peri-operatively   2.  Coronary artery disease: History of NSTEMI in 2017, status post Premier DES to the mid and distal RCA.  He is on aspirin only along with secondary prevention to include statin therapy, blood pressure control, and activities.  No changes on his regimen as he is asymptomatic.  No plans for any cardiac testing preprocedure.  3.  Hyperlipidemia: This is followed by primary care provider, with labs to be completed in August  2023.  Continue Vascepa and atorvastatin as directed.  4.  Type 2 diabetes: Followed by PCP.  Current medicines are reviewed at length with the patient today.  I have spent 25 min's  dedicated to the care of this patient on the date of this encounter to include pre-visit review of records, assessment, management and diagnostic testing,with shared decision making. Signed, Bettey Mare. Liborio Nixon, ANP, [facility]   08/16/2021 8:47 AM    [facility] [address] Office [phone] Fax [phone]  Notice: This dictation was prepared with Dragon dictation along with smaller phrase technology. Any transcriptional errors that result from this process are unintentional and may not be corrected upon review.

## 2021-08-16 ENCOUNTER — Encounter: Payer: Self-pay | Admitting: Adult Health

## 2021-08-16 ENCOUNTER — Ambulatory Visit: Payer: BC Managed Care – PPO | Admitting: Adult Health

## 2021-08-16 VITALS — BP 100/60 | HR 80 | Ht 66.0 in | Wt 136.6 lb

## 2021-08-16 DIAGNOSIS — Z01810 Encounter for preprocedural cardiovascular examination: Secondary | ICD-10-CM

## 2021-08-16 DIAGNOSIS — E7800 Pure hypercholesterolemia, unspecified: Secondary | ICD-10-CM | POA: Diagnosis not present

## 2021-08-16 DIAGNOSIS — I2510 Atherosclerotic heart disease of native coronary artery without angina pectoris: Secondary | ICD-10-CM

## 2021-08-16 DIAGNOSIS — I10 Essential (primary) hypertension: Secondary | ICD-10-CM

## 2021-08-16 MED ORDER — METOPROLOL SUCCINATE ER 25 MG PO TB24: 25.0000 mg | ORAL_TABLET | Freq: Every day | ORAL | 3 refills | Status: DC

## 2021-08-16 NOTE — Patient Instructions (Addendum)
Medication Instructions:  Your physician recommends that you continue on your current medications as directed. Please refer to the Current Medication list given to you today.   *If you need a refill on your cardiac medications before your next appointment, please call your pharmacy*   Lab Work: NONE ordered at this time of appointment   If you have labs (blood work) drawn today and your tests are completely normal, you will receive your results only by: MyChart Message (if you have MyChart) OR A paper copy in the mail If you have any lab test that is abnormal or we need to change your treatment, we will call you to review the results.   Testing/Procedures: NONE ordered at this time of appointment     Follow-Up: At [facility], you and your health needs are our priority.  As part of our continuing mission to provide you with exceptional heart care, we have created designated Provider Care Teams.  These Care Teams include your primary Cardiologist (physician) and Advanced Practice Providers (APPs -  Physician Assistants and Nurse Practitioners) who all work together to provide you with the care you need, when you need it.  We recommend signing up for the patient portal called "MyChart".  Sign up information is provided on this After Visit Summary.  MyChart is used to connect with patients for Virtual Visits (Telemedicine).  Patients are able to view lab/test results, encounter notes, upcoming appointments, etc.  Non-urgent messages can be sent to your provider as well.   To learn more about what you can do with MyChart, go to [email or web address].    Your next appointment:   8 month(s)  The format for your next appointment:   In Person  Provider:   Peter Swaziland, MD     Other Instructions Cardiac Clearance approved for Right Shoulder.  Important Information About Sugar

## 2021-09-19 ENCOUNTER — Other Ambulatory Visit (HOSPITAL_COMMUNITY): Payer: Self-pay

## 2021-09-21 ENCOUNTER — Ambulatory Visit (HOSPITAL_BASED_OUTPATIENT_CLINIC_OR_DEPARTMENT_OTHER): Payer: BC Managed Care – PPO

## 2021-09-21 DIAGNOSIS — Z0000 Encounter for general adult medical examination without abnormal findings: Secondary | ICD-10-CM

## 2021-09-22 LAB — CBC WITH DIFFERENTIAL/PLATELET
Basophils Absolute: 0.1 10*3/uL (ref 0.0–0.2)
Basos: 1 %
EOS (ABSOLUTE): 0.2 10*3/uL (ref 0.0–0.4)
Eos: 2 %
Hematocrit: 44.4 % (ref 37.5–51.0)
Hemoglobin: 15.1 g/dL (ref 13.0–17.7)
Immature Grans (Abs): 0 10*3/uL (ref 0.0–0.1)
Immature Granulocytes: 0 %
Lymphocytes Absolute: 2.9 10*3/uL (ref 0.7–3.1)
Lymphs: 28 %
MCH: 29.5 pg (ref 26.6–33.0)
MCHC: 34 g/dL (ref 31.5–35.7)
MCV: 87 fL (ref 79–97)
Monocytes Absolute: 0.7 10*3/uL (ref 0.1–0.9)
Monocytes: 7 %
Neutrophils Absolute: 6.7 10*3/uL (ref 1.4–7.0)
Neutrophils: 62 %
Platelets: 326 10*3/uL (ref 150–450)
RBC: 5.11 x10E6/uL (ref 4.14–5.80)
RDW: 12.6 % (ref 11.6–15.4)
WBC: 10.6 10*3/uL (ref 3.4–10.8)

## 2021-09-22 LAB — COMPREHENSIVE METABOLIC PANEL
ALT: 58 IU/L — ABNORMAL HIGH (ref 0–44)
AST: 34 IU/L (ref 0–40)
Albumin/Globulin Ratio: 2 (ref 1.2–2.2)
Albumin: 4.3 g/dL (ref 3.8–4.9)
Alkaline Phosphatase: 80 IU/L (ref 44–121)
BUN/Creatinine Ratio: 10 (ref 9–20)
BUN: 11 mg/dL (ref 6–24)
Bilirubin Total: 0.6 mg/dL (ref 0.0–1.2)
CO2: 25 mmol/L (ref 20–29)
Calcium: 9.1 mg/dL (ref 8.7–10.2)
Chloride: 101 mmol/L (ref 96–106)
Creatinine, Ser: 1.08 mg/dL (ref 0.76–1.27)
Globulin, Total: 2.1 g/dL (ref 1.5–4.5)
Glucose: 105 mg/dL — ABNORMAL HIGH (ref 70–99)
Potassium: 4.4 mmol/L (ref 3.5–5.2)
Sodium: 139 mmol/L (ref 134–144)
Total Protein: 6.4 g/dL (ref 6.0–8.5)
eGFR: 79 mL/min/{1.73_m2} (ref >59)

## 2021-09-22 LAB — TSH RFX ON ABNORMAL TO FREE T4: TSH: 2.58 u[IU]/mL (ref 0.450–4.500)

## 2021-09-22 LAB — HEMOGLOBIN A1C
Est. average glucose Bld gHb Est-mCnc: 169 mg/dL
Hgb A1c MFr Bld: 7.5 % — ABNORMAL HIGH (ref 4.8–5.6)

## 2021-09-22 LAB — LIPID PANEL
Chol/HDL Ratio: 1.5 ratio (ref 0.0–5.0)
Cholesterol, Total: 54 mg/dL — ABNORMAL LOW (ref 100–199)
HDL: 36 mg/dL — ABNORMAL LOW (ref >39)
LDL Chol Calc (NIH): 5 mg/dL (ref 0–99)
Triglycerides: 46 mg/dL (ref 0–149)
VLDL Cholesterol Cal: 13 mg/dL (ref 5–40)

## 2021-09-27 ENCOUNTER — Ambulatory Visit (INDEPENDENT_AMBULATORY_CARE_PROVIDER_SITE_OTHER): Payer: BC Managed Care – PPO | Admitting: Family Medicine

## 2021-09-27 ENCOUNTER — Encounter (HOSPITAL_BASED_OUTPATIENT_CLINIC_OR_DEPARTMENT_OTHER): Payer: Self-pay | Admitting: Family Medicine

## 2021-09-27 VITALS — BP 106/69 | HR 74 | Temp 97.8°F | Ht 66.0 in | Wt 135.5 lb

## 2021-09-27 DIAGNOSIS — Z23 Encounter for immunization: Secondary | ICD-10-CM

## 2021-09-27 DIAGNOSIS — Z0000 Encounter for general adult medical examination without abnormal findings: Secondary | ICD-10-CM | POA: Diagnosis not present

## 2021-09-27 DIAGNOSIS — E1121 Type 2 diabetes mellitus with diabetic nephropathy: Secondary | ICD-10-CM | POA: Diagnosis not present

## 2021-09-27 DIAGNOSIS — Z794 Long term (current) use of insulin: Secondary | ICD-10-CM | POA: Diagnosis not present

## 2021-09-27 MED ORDER — SHINGRIX 50 MCG/0.5ML IM SUSR: 0.5000 mL | Freq: Once | INTRAMUSCULAR | 0 refills | Status: AC

## 2021-09-27 NOTE — Assessment & Plan Note (Addendum)
Routine HCM labs reviewed. HCM reviewed/discussed. Anticipatory guidance regarding healthy weight, lifestyle and choices given. Recommend healthy diet.  Recommend approximately 150 minutes/week of moderate intensity exercise Recommend regular dental and vision exams Always use seatbelt/lap and shoulder restraints Recommend using smoke alarms and checking batteries at least twice a year Recommend using sunscreen when outside Discussed colon cancer screening recommendations, options.  Patient reports having completed Cologuard with prior PCP, this was in February 2022, he reports that findings were negative, health maintenance updated. Discussed recommendations for shingles vaccine.  Patient is amenable, prescription sent to pharmacy Discussed tetanus immunization recommendations, patient agreed to proceed with this today

## 2021-09-27 NOTE — Patient Instructions (Signed)

## 2021-09-27 NOTE — Progress Notes (Signed)
Subjective:    CC: Annual Physical Exam  HPI:  Christopher Cooper is a 61 y.o. presenting for annual physical  I reviewed the past medical history, family history, social history, surgical history, and allergies today and no changes were needed.  Please see the problem list section below in epic for further details.  Past Medical History: Past Medical History:  Diagnosis Date   Asbestos exposure    CAD in native artery    Diabetes (HCC)    GERD (gastroesophageal reflux disease)    Hyperlipidemia LDL goal <70    Hypertension    S/P angioplasty with stent, 11/25/15 to mRCA to dRCA promus premier DES 11/26/2015   Past Surgical History: Past Surgical History:  Procedure Laterality Date   ANKLE ARTHROSCOPY Left    after injury   CARDIAC CATHETERIZATION N/A 11/25/2015   Procedure: Left Heart Cath and Coronary Angiography;  Surgeon: Peter M Swaziland, MD;  Location: [facility] INVASIVE CV LAB;  Service: Cardiovascular;  Laterality: N/A;   CARDIAC CATHETERIZATION N/A 11/25/2015   Procedure: Coronary Stent Intervention;  Surgeon: Peter M Swaziland, MD;  Location: [facility] INVASIVE CV LAB;  Service: Cardiovascular;  Laterality: N/A;   CORONARY ANGIOPLASTY WITH STENT PLACEMENT     Social History: Social History   Socioeconomic History   Marital status: Married    Spouse name: Not on file   Number of children: Not on file   Years of education: Not on file   Highest education level: Not on file  Occupational History   Occupation: Social research officer, government at [employer]  Tobacco Use   Smoking status: Former    Packs/day: 0.25    Types: Cigarettes   Smokeless tobacco: Never  Substance and Sexual Activity   Alcohol use: No   Drug use: No   Sexual activity: Yes    Birth control/protection: Other-see comments  Other Topics Concern   Not on file  Social History Narrative   He lives in [place] with his wife.   Social Determinants of Health   Financial Resource Strain: Not on file  Food Insecurity: Not on  file  Transportation Needs: Not on file  Physical Activity: Not on file  Stress: Not on file  Social Connections: Not on file   Family History: Family History  Problem Relation Age of Onset   Diabetes Mellitus II Mother    Diabetes Mellitus II Father    CAD Other        Reports multiple distant relatives with history of MI   Allergies: No Known Allergies Medications: See med rec.  Review of Systems: No headache, visual changes, nausea, vomiting, diarrhea, constipation, dizziness, abdominal pain, skin rash, fevers, chills, night sweats, swollen lymph nodes, weight loss, chest pain, body aches, joint swelling, muscle aches, shortness of breath, mood changes, visual or auditory hallucinations.  Objective:    BP 106/69   Pulse 74   Temp 97.8 F (36.6 C) (Oral)   Ht 5\' 6"  (1.676 m)   Wt 135 lb 8 oz (61.5 kg)   SpO2 99%   BMI 21.87 kg/m   General: Well Developed, well nourished, and in no acute distress. Neuro: Alert and oriented x3, extra-ocular muscles intact, sensation grossly intact. Cranial nerves II through XII are intact, motor, sensory, and coordinative functions are all intact. HEENT: Normocephalic, atraumatic, pupils equal round reactive to light, neck supple, no masses, no lymphadenopathy, thyroid nonpalpable. Oropharynx, nasopharynx, external ear canals are unremarkable. Skin: Warm and dry, no rashes noted. Cardiac: Regular rate and  rhythm, no murmurs rubs or gallops. Respiratory: Clear to auscultation bilaterally. Not using accessory muscles, speaking in full sentences. Abdominal: Soft, nontender, nondistended, positive bowel sounds, no masses, no organomegaly. Musculoskeletal: Shoulder, elbow, wrist, hip, knee, ankle stable, and with full range of motion.  Impression and Recommendations:    Wellness examination Routine HCM labs reviewed. HCM reviewed/discussed. Anticipatory guidance regarding healthy weight, lifestyle and choices given. Recommend healthy diet.   Recommend approximately 150 minutes/week of moderate intensity exercise Recommend regular dental and vision exams Always use seatbelt/lap and shoulder restraints Recommend using smoke alarms and checking batteries at least twice a year Recommend using sunscreen when outside Discussed colon cancer screening recommendations, options.  Patient reports having completed Cologuard with prior PCP, this was in February 2022, he reports that findings were negative, health maintenance updated. Discussed recommendations for shingles vaccine.  Patient is amenable, prescription sent to pharmacy Discussed tetanus immunization recommendations, patient agreed to proceed with this today  Return in about 3 months (around 12/28/2021).   ___________________________________________ Ryelan Kazee de [place], MD, ABFM, CAQSM Primary Care and Sports Medicine [facility]

## 2021-09-28 ENCOUNTER — Other Ambulatory Visit (HOSPITAL_BASED_OUTPATIENT_CLINIC_OR_DEPARTMENT_OTHER): Payer: Self-pay | Admitting: Family Medicine

## 2021-09-28 ENCOUNTER — Other Ambulatory Visit (HOSPITAL_BASED_OUTPATIENT_CLINIC_OR_DEPARTMENT_OTHER): Payer: Self-pay

## 2021-09-28 DIAGNOSIS — E1121 Type 2 diabetes mellitus with diabetic nephropathy: Secondary | ICD-10-CM

## 2021-09-28 LAB — SPECIMEN STATUS REPORT

## 2021-09-28 LAB — MICROALBUMIN / CREATININE URINE RATIO
Creatinine, Urine: 25.8 mg/dL
Microalb/Creat Ratio: <12 mg/g creat (ref 0–29)
Microalbumin, Urine: <3 ug/mL

## 2021-09-28 MED ORDER — TRESIBA FLEXTOUCH 100 UNIT/ML ~~LOC~~ SOPN: 36.0000 [IU] | PEN_INJECTOR | Freq: Every day | SUBCUTANEOUS | 1 refills | Status: DC

## 2021-10-16 ENCOUNTER — Other Ambulatory Visit (HOSPITAL_COMMUNITY): Payer: Self-pay

## 2021-10-30 ENCOUNTER — Other Ambulatory Visit (HOSPITAL_COMMUNITY): Payer: Self-pay

## 2021-11-23 ENCOUNTER — Other Ambulatory Visit (HOSPITAL_COMMUNITY): Payer: Self-pay

## 2021-12-22 ENCOUNTER — Other Ambulatory Visit (HOSPITAL_COMMUNITY): Payer: Self-pay

## 2021-12-28 ENCOUNTER — Ambulatory Visit (HOSPITAL_BASED_OUTPATIENT_CLINIC_OR_DEPARTMENT_OTHER): Payer: BC Managed Care – PPO | Admitting: Family Medicine

## 2022-01-05 ENCOUNTER — Other Ambulatory Visit (HOSPITAL_COMMUNITY): Payer: Self-pay

## 2022-01-08 ENCOUNTER — Other Ambulatory Visit (HOSPITAL_COMMUNITY): Payer: Self-pay

## 2022-01-18 ENCOUNTER — Ambulatory Visit (INDEPENDENT_AMBULATORY_CARE_PROVIDER_SITE_OTHER): Payer: 59 | Admitting: Family Medicine

## 2022-01-18 ENCOUNTER — Encounter (HOSPITAL_BASED_OUTPATIENT_CLINIC_OR_DEPARTMENT_OTHER): Payer: Self-pay | Admitting: Family Medicine

## 2022-01-18 ENCOUNTER — Other Ambulatory Visit (HOSPITAL_BASED_OUTPATIENT_CLINIC_OR_DEPARTMENT_OTHER): Payer: Self-pay

## 2022-01-18 VITALS — BP 112/77 | HR 79 | Ht 66.0 in | Wt 137.6 lb

## 2022-01-18 DIAGNOSIS — E1121 Type 2 diabetes mellitus with diabetic nephropathy: Secondary | ICD-10-CM | POA: Diagnosis not present

## 2022-01-18 DIAGNOSIS — Z794 Long term (current) use of insulin: Secondary | ICD-10-CM

## 2022-01-18 DIAGNOSIS — R251 Tremor, unspecified: Secondary | ICD-10-CM | POA: Diagnosis not present

## 2022-01-18 MED ORDER — BD PEN NEEDLE SHORT U/F 31G X 8 MM MISC: 1 refills | Status: AC

## 2022-01-18 MED ORDER — OZEMPIC (2 MG/DOSE) 8 MG/3ML ~~LOC~~ SOPN
PEN_INJECTOR | SUBCUTANEOUS | 3 refills | Status: DC
  Filled 2022-01-18: qty 9, 84d supply, fill #0
  Filled 2022-01-31 – 2022-02-02 (×2): qty 3, 28d supply, fill #0
  Filled 2022-02-28 – 2022-03-08 (×2): qty 3, 28d supply, fill #1
  Filled 2022-04-01: qty 3, 28d supply, fill #2

## 2022-01-18 NOTE — Patient Instructions (Signed)
  Medication Instructions:  Your physician recommends that you continue on your current medications as directed. Please refer to the Current Medication list given to you today. --If you need a refill on any your medications before your next appointment, please call your pharmacy first. If no refills are authorized on file call the office.-- Lab Work: Your physician has recommended that you have lab work today: Yes If you have labs (blood work) drawn today and your tests are completely normal, you will receive your results via MyChart message OR a phone call from our staff.  Please ensure you check your voicemail in the event that you authorized detailed messages to be left on a delegated number. If you have any lab test that is abnormal or we need to change your treatment, we will call you to review the results.  Referrals/Procedures/Imaging: No  Follow-Up: Your next appointment:   Your physician recommends that you schedule a follow-up appointment in: 3 months with Dr. de [place].  You will receive a text message or e-mail with a link to a survey about your care and experience with us today! We would greatly appreciate your feedback!   Thanks for letting us be apart of your health journey!!  Primary Care and Sports Medicine   Dr. Raymond de Cuba   We encourage you to activate your patient portal called "MyChart".  Sign up information is provided on this After Visit Summary.  MyChart is used to connect with patients for Virtual Visits (Telemedicine).  Patients are able to view lab/test results, encounter notes, upcoming appointments, etc.  Non-urgent messages can be sent to your provider as well. To learn more about what you can do with MyChart, please visit --  [email or web address].    

## 2022-01-18 NOTE — Assessment & Plan Note (Signed)
Patient reports that his wife has been more concerned regarding tremor that he has been having.  Ultimately, tremor has not been too concerning for patient, he has not generally been to aware of it and there is more systemic that his wife has noted.  He denies having tremor at rest, typically will occur when engaging activities such as holding his phone or when he is going to do something with his hands.  Tremor will occur in both hands equally, does not feel that 1 is more effective than the other.  He does not have any other neurologic symptoms, denies any weakness, numbness or tingling, no change in gait, no recent falls. He did have labs completed as part of physical a few months ago and these labs were normal including electrolytes and thyroid function testing Suspect that ongoing tremor is related to essential tremor.  No signs or symptoms suggestive of more concerning etiology such as Parkinson's.  Symptoms are generally not significantly impactful to patient and thus at this time, would be reasonable to continue with monitoring.  He is currently on beta-blocker therapy related to history of NSTEMI

## 2022-01-18 NOTE — Progress Notes (Signed)
    Procedures performed today:    None.  Independent interpretation of notes and tests performed by another provider:   None.  Brief History, Exam, Impression, and Recommendations:    BP 112/77 (BP Location: Right Arm, Patient Position: Sitting, Cuff Size: Large)   Pulse 79   Ht 5\' 6"  (1.676 m)   Wt 137 lb 9.6 oz (62.4 kg)   SpO2 100%   BMI 22.21 kg/m   Diabetes mellitus (HCC) Patient reports that he has been doing well.  He continues with , 36 units daily, Jardiance, Ozempic 2 mg dose, metformin.  Denies any issues with medication at this time.  He is requesting refill of Ozempic today.  Most recent hemoglobin A1c was slightly above goal, he is due for recheck at this time.  He has been checking fasting blood sugar readings and generally these have been controlled with occasional low readings less frequent hyperglycemia.  He notes that hyperglycemia will occur when he has had more dietary indiscretions, particularly eating later at night We will check hemoglobin A1c today Plan to complete foot exam at next office visit  Tremor Patient reports that his wife has been more concerned regarding tremor that he has been having.  Ultimately, tremor has not been too concerning for patient, he has not generally been to aware of it and there is more systemic that his wife has noted.  He denies having tremor at rest, typically will occur when engaging activities such as holding his phone or when he is going to do something with his hands.  Tremor will occur in both hands equally, does not feel that 1 is more effective than the other.  He does not have any other neurologic symptoms, denies any weakness, numbness or tingling, no change in gait, no recent falls. He did have labs completed as part of physical a few months ago and these labs were normal including electrolytes and thyroid function testing Suspect that ongoing tremor is related to essential tremor.  No signs or symptoms suggestive  of more concerning etiology such as Parkinson's.  Symptoms are generally not significantly impactful to patient and thus at this time, would be reasonable to continue with monitoring.  He is currently on beta-blocker therapy related to history of NSTEMI  Return in about 3 months (around 04/19/2022) for DM.   ___________________________________________ Aranza Geddes de 06/19/2022, MD, ABFM, CAQSM Primary Care and Sports Medicine [facility]

## 2022-01-18 NOTE — Assessment & Plan Note (Signed)
Patient reports that he has been doing well.  He continues with [place], 36 units daily, Jardiance, Ozempic 2 mg dose, metformin.  Denies any issues with medication at this time.  He is requesting refill of Ozempic today.  Most recent hemoglobin A1c was slightly above goal, he is due for recheck at this time.  He has been checking fasting blood sugar readings and generally these have been controlled with occasional low readings less frequent hyperglycemia.  He notes that hyperglycemia will occur when he has had more dietary indiscretions, particularly eating later at night We will check hemoglobin A1c today Plan to complete foot exam at next office visit

## 2022-01-19 LAB — HEMOGLOBIN A1C
Est. average glucose Bld gHb Est-mCnc: 163 mg/dL
Hgb A1c MFr Bld: 7.3 % — ABNORMAL HIGH (ref 4.8–5.6)

## 2022-01-31 ENCOUNTER — Encounter (HOSPITAL_BASED_OUTPATIENT_CLINIC_OR_DEPARTMENT_OTHER): Payer: Self-pay | Admitting: Pharmacist

## 2022-01-31 ENCOUNTER — Other Ambulatory Visit (HOSPITAL_BASED_OUTPATIENT_CLINIC_OR_DEPARTMENT_OTHER): Payer: Self-pay

## 2022-02-01 ENCOUNTER — Other Ambulatory Visit: Payer: Self-pay

## 2022-02-02 ENCOUNTER — Other Ambulatory Visit (HOSPITAL_BASED_OUTPATIENT_CLINIC_OR_DEPARTMENT_OTHER): Payer: Self-pay

## 2022-02-03 ENCOUNTER — Other Ambulatory Visit (HOSPITAL_BASED_OUTPATIENT_CLINIC_OR_DEPARTMENT_OTHER): Payer: Self-pay | Admitting: Family Medicine

## 2022-02-03 DIAGNOSIS — E1121 Type 2 diabetes mellitus with diabetic nephropathy: Secondary | ICD-10-CM

## 2022-02-06 ENCOUNTER — Other Ambulatory Visit (HOSPITAL_BASED_OUTPATIENT_CLINIC_OR_DEPARTMENT_OTHER): Payer: Self-pay

## 2022-02-07 ENCOUNTER — Other Ambulatory Visit (HOSPITAL_BASED_OUTPATIENT_CLINIC_OR_DEPARTMENT_OTHER): Payer: Self-pay | Admitting: Family Medicine

## 2022-02-07 DIAGNOSIS — Z794 Long term (current) use of insulin: Secondary | ICD-10-CM

## 2022-02-08 ENCOUNTER — Other Ambulatory Visit (HOSPITAL_BASED_OUTPATIENT_CLINIC_OR_DEPARTMENT_OTHER): Payer: Self-pay | Admitting: Family Medicine

## 2022-02-08 DIAGNOSIS — Z794 Long term (current) use of insulin: Secondary | ICD-10-CM

## 2022-02-09 ENCOUNTER — Other Ambulatory Visit (HOSPITAL_BASED_OUTPATIENT_CLINIC_OR_DEPARTMENT_OTHER): Payer: Self-pay | Admitting: Family Medicine

## 2022-02-09 DIAGNOSIS — Z794 Long term (current) use of insulin: Secondary | ICD-10-CM

## 2022-03-06 ENCOUNTER — Other Ambulatory Visit: Payer: Self-pay

## 2022-04-01 ENCOUNTER — Other Ambulatory Visit (HOSPITAL_BASED_OUTPATIENT_CLINIC_OR_DEPARTMENT_OTHER): Payer: Self-pay

## 2022-04-02 ENCOUNTER — Other Ambulatory Visit (HOSPITAL_BASED_OUTPATIENT_CLINIC_OR_DEPARTMENT_OTHER): Payer: Self-pay

## 2022-04-19 ENCOUNTER — Encounter (HOSPITAL_BASED_OUTPATIENT_CLINIC_OR_DEPARTMENT_OTHER): Payer: Self-pay | Admitting: Family Medicine

## 2022-04-19 ENCOUNTER — Ambulatory Visit (INDEPENDENT_AMBULATORY_CARE_PROVIDER_SITE_OTHER): Payer: 59 | Admitting: Family Medicine

## 2022-04-19 VITALS — BP 112/72 | HR 86 | Ht 66.0 in | Wt 130.8 lb

## 2022-04-19 DIAGNOSIS — Z794 Long term (current) use of insulin: Secondary | ICD-10-CM

## 2022-04-19 DIAGNOSIS — E1121 Type 2 diabetes mellitus with diabetic nephropathy: Secondary | ICD-10-CM

## 2022-04-19 LAB — HEMOGLOBIN A1C
Est. average glucose Bld gHb Est-mCnc: 214 mg/dL
Hgb A1c MFr Bld: 9.1 % — ABNORMAL HIGH (ref 4.8–5.6)

## 2022-04-19 MED ORDER — METFORMIN HCL ER 500 MG PO TB24: 2000.0000 mg | ORAL_TABLET | Freq: Every morning | ORAL | 1 refills | Status: DC

## 2022-04-19 MED ORDER — OZEMPIC (2 MG/DOSE) 8 MG/3ML ~~LOC~~ SOPN: PEN_INJECTOR | SUBCUTANEOUS | 3 refills | Status: DC

## 2022-04-19 NOTE — Assessment & Plan Note (Signed)
Patient reports that he has been doing well.  He continues with Lantus, metformin, Jardiance, Ozempic.  He does report that most recent prescription of metformin was at a lower dose than what he had utilized in the past.  He reports that previously he was using 2000 mg once daily of the extended release and most recent prescription was for 1000 mg once daily.  He is currently out of metformin.  He continues to check his blood sugars, these have been somewhat higher recently which he correlates with dietary indiscretions.  Most recent hemoglobin A1c was about 3 months ago and was slightly above goal at 7.3%, however this was improved from prior checks. Today, we will proceed with recheck of hemoglobin A1c We can allow for patient to transition back to 2000 mg dose of metformin, new prescription sent to pharmacy on file.  Can continue with the patient's as otherwise prescribed Foot exam pleated today, documented in chart He does have an eye doctor that he sees, recommend that he have them forward information to our office when he sees them next, he indicates that he is due for back at this time Will plan for follow-up in about 3 months to monitor progress or sooner as needed

## 2022-04-19 NOTE — Progress Notes (Signed)
    Procedures performed today:    None.  Independent interpretation of notes and tests performed by another provider:   None.  Brief History, Exam, Impression, and Recommendations:    BP 112/72 (BP Location: Right Arm, Patient Position: Sitting, Cuff Size: Large)   Pulse 86   Ht '5\' 6"'$  (1.676 m)   Wt 130 lb 12.8 oz (59.3 kg)   SpO2 100%   BMI 21.11 kg/m   Diabetes mellitus ([place]) Patient reports that he has been doing well.  He continues with Lantus, metformin, Jardiance, Ozempic.  He does report that most recent prescription of metformin was at a lower dose than what he had utilized in the past.  He reports that previously he was using 2000 mg once daily of the extended release and most recent prescription was for 1000 mg once daily.  He is currently out of metformin.  He continues to check his blood sugars, these have been somewhat higher recently which he correlates with dietary indiscretions.  Most recent hemoglobin A1c was about 3 months ago and was slightly above goal at 7.3%, however this was improved from prior checks. Today, we will proceed with recheck of hemoglobin A1c We can allow for patient to transition back to 2000 mg dose of metformin, new prescription sent to pharmacy on file.  Can continue with the patient's as otherwise prescribed Foot exam pleated today, documented in chart He does have an eye doctor that he sees, recommend that he have them forward information to our office when he sees them next, he indicates that he is due for back at this time Will plan for follow-up in about 3 months to monitor progress or sooner as needed  Return in about 3 months (around 07/20/2022) for DM.   ___________________________________________ Stevon Gough de [place], MD, ABFM, CAQSM [facility] [place]

## 2022-05-03 ENCOUNTER — Telehealth: Payer: Self-pay | Admitting: *Deleted

## 2022-05-03 NOTE — Patient Outreach (Signed)
  Care Coordination   05/03/2022 Name: THORSEN SPIKER MRN: [id number] DOB: [birth year]   Care Coordination Outreach Attempts:  An unsuccessful telephone outreach was attempted today to offer the patient information about available care coordination services as a benefit of their health plan.   Follow Up Plan:  Additional outreach attempts will be made to offer the patient care coordination information and services.   Encounter Outcome:  No Answer   Care Coordination Interventions:  No, not indicated    Eduard Clos, MSW, [place] Worker [employer] [facility] [phone]

## 2022-05-21 ENCOUNTER — Other Ambulatory Visit (HOSPITAL_BASED_OUTPATIENT_CLINIC_OR_DEPARTMENT_OTHER): Payer: Self-pay | Admitting: Family Medicine

## 2022-05-21 DIAGNOSIS — Z794 Long term (current) use of insulin: Secondary | ICD-10-CM

## 2022-07-05 ENCOUNTER — Other Ambulatory Visit (HOSPITAL_BASED_OUTPATIENT_CLINIC_OR_DEPARTMENT_OTHER): Payer: Self-pay

## 2022-07-05 ENCOUNTER — Ambulatory Visit (INDEPENDENT_AMBULATORY_CARE_PROVIDER_SITE_OTHER): Payer: 59 | Admitting: Family Medicine

## 2022-07-05 VITALS — BP 106/76 | HR 95 | Temp 97.7°F | Ht 66.0 in | Wt 130.0 lb

## 2022-07-05 DIAGNOSIS — L0291 Cutaneous abscess, unspecified: Secondary | ICD-10-CM | POA: Diagnosis not present

## 2022-07-05 MED ORDER — SULFAMETHOXAZOLE-TRIMETHOPRIM 800-160 MG PO TABS
1.0000 | ORAL_TABLET | Freq: Two times a day (BID) | ORAL | 0 refills | Status: DC
  Filled 2022-07-05: qty 14, 7d supply, fill #0

## 2022-07-05 NOTE — Progress Notes (Signed)
    Procedures performed today:    Abscess drainage Performed by: Christopher Cooper Verbal informed consent obtained, risks and benefits discussed Timeout taken to confirm patient, procedure, equipment, site/side Location details: Posterior neck Anesthesia: Local infiltration with lidocaine 1% with epinephrine Anesthetic total: 1 mL Description: Patient presented with abscess on posterior neck.  Small amount of purulent material was expressed.  Abscess was explored using a cotton Q-tip. Cavity with depth of about 0.5 cm.  Wound was irrigated with sterile normal saline.  Surrounding soft tissue notably indurated.  Due to small size of the wound cavity, no packing utilized.  Wound was covered with 4 x 4 gauze and paper tape to protect the area and allow for continued drainage.  Patient was instructed on proper care and precautions.  Blood loss minimal.  Patient tolerated procedure extremely well with no immediate complications.  Independent interpretation of notes and tests performed by another provider:   None.  Brief History, Exam, Impression, and Recommendations:    BP 106/76 (BP Location: Right Arm, Patient Position: Sitting, Cuff Size: Normal)   Pulse 95   Temp 97.7 F (36.5 C) (Oral)   Ht 5\' 6"  (1.676 m)   Wt 130 lb (59 kg)   SpO2 97%   BMI 20.98 kg/m   Abscess Patient reports that for decades he has had small lump on posterior neck which has been there and caused no issues typically.  About 10 days ago, he noted increased pain and swelling of the area.  Earlier this week, he felt that there was fluid leaking from the site.  Area has been more tender.  He has not had any fever, chills, sweats.  Denies any allergies to medications. On exam, notable lump over posterior neck, slightly left of midline.  Area with surrounding erythema and warmth.  Small amount of dried/crusted sanguinous appearing fluid over top of lump.  It is tender to palpation, mild fluctuance, notable surrounding  induration.  On palpation and peripheral pressure, small amount of purulent material expressed through small opening over top of lump. Exam consistent with abscess.  Based on history, does appear that small cyst was present which may have become infected.  Discussed with patient.  Recommended proceeding with I&D in office today which patient was in agreement with, procedure note above.  Additional purulent material expressed.  Given notable surrounding erythema, induration, discussed role of antibiotic therapy, patient amenable, prescription sent to pharmacy. Will plan for follow-up in about 1 week to monitor progress, determine if further additional drainage is needed and assess response to antibiotic therapy. Patient may need further evaluation with dermatology as underlying issue may have been cyst which may require excision for definitive treatment  Return in about 1 week (around 07/12/2022).   ___________________________________________ Christopher Lajeunesse de [place], MD, ABFM, CAQSM Primary Care and Sports Medicine [facility]

## 2022-07-05 NOTE — Patient Instructions (Signed)
  Medication Instructions:  Your physician recommends that you continue on your current medications as directed. Please refer to the Current Medication list given to you today. --If you need a refill on any your medications before your next appointment, please call your pharmacy first. If no refills are authorized on file call the office.-- Lab Work: Your physician has recommended that you have lab work today: No If you have labs (blood work) drawn today and your tests are completely normal, you will receive your results via MyChart message OR a phone call from our staff.  Please ensure you check your voicemail in the event that you authorized detailed messages to be left on a delegated number. If you have any lab test that is abnormal or we need to change your treatment, we will call you to review the results.  Referrals/Procedures/Imaging: No  Follow-Up: Your next appointment:   Your physician recommends that you schedule a follow-up appointment in: 1 week follow-up with Dr. de [place].  You will receive a text message or e-mail with a link to a survey about your care and experience with us today! We would greatly appreciate your feedback!   Thanks for letting us be apart of your health journey!!  Primary Care and Sports Medicine   Dr. Raymond de [place]   We encourage you to activate your patient portal called "MyChart".  Sign up information is provided on this After Visit Summary.  MyChart is used to connect with patients for Virtual Visits (Telemedicine).  Patients are able to view lab/test results, encounter notes, upcoming appointments, etc.  Non-urgent messages can be sent to your provider as well. To learn more about what you can do with MyChart, please visit --  [email or web address].    

## 2022-07-06 NOTE — Assessment & Plan Note (Signed)
Patient reports that for decades he has had small lump on posterior neck which has been there and caused no issues typically.  About 10 days ago, he noted increased pain and swelling of the area.  Earlier this week, he felt that there was fluid leaking from the site.  Area has been more tender.  He has not had any fever, chills, sweats.  Denies any allergies to medications. On exam, notable lump over posterior neck, slightly left of midline.  Area with surrounding erythema and warmth.  Small amount of dried/crusted sanguinous appearing fluid over top of lump.  It is tender to palpation, mild fluctuance, notable surrounding induration.  On palpation and peripheral pressure, small amount of purulent material expressed through small opening over top of lump. Exam consistent with abscess.  Based on history, does appear that small cyst was present which may have become infected.  Discussed with patient.  Recommended proceeding with I&D in office today which patient was in agreement with, procedure note above.  Additional purulent material expressed.  Given notable surrounding erythema, induration, discussed role of antibiotic therapy, patient amenable, prescription sent to pharmacy. Will plan for follow-up in about 1 week to monitor progress, determine if further additional drainage is needed and assess response to antibiotic therapy. Patient may need further evaluation with dermatology as underlying issue may have been cyst which may require excision for definitive treatment

## 2022-07-12 ENCOUNTER — Encounter (HOSPITAL_BASED_OUTPATIENT_CLINIC_OR_DEPARTMENT_OTHER): Payer: Self-pay | Admitting: Family Medicine

## 2022-07-12 ENCOUNTER — Ambulatory Visit (INDEPENDENT_AMBULATORY_CARE_PROVIDER_SITE_OTHER): Payer: 59 | Admitting: Family Medicine

## 2022-07-12 VITALS — BP 98/71 | HR 78 | Ht 66.0 in | Wt 125.4 lb

## 2022-07-12 DIAGNOSIS — L0291 Cutaneous abscess, unspecified: Secondary | ICD-10-CM

## 2022-07-12 NOTE — Assessment & Plan Note (Signed)
Patient reports that he has been doing well since last visit.  He did continue to have some drainage of the area.  Has been taking antibiotic as prescribed, denies any side effects or issues with medication.  Still has 1 antibiotic remaining. On exam, abscess appears to be smaller in size compared to initial presentation.  Still with some mild erythema overlying abscess.  Area is healed with no drainage expressed on exam, some fluctuance noted Discussed that given persistent swelling, fluctuance, feel that the incision will be needed in order to allow for further drainage of the area, patient amenable to proceeding with this today.  See procedure note above. Will plan for follow-up at previously scheduled appointment next week to monitor progress.  Given presence of swelling prior to recent infection, feel that further evaluation with dermatology would be appropriate, referral placed today

## 2022-07-12 NOTE — Progress Notes (Signed)
    Procedures performed today:    Abscess drainage Performed by: Kensli Bowley de [place] Verbal informed consent obtained, risks and benefits discussed Timeout taken to confirm patient, procedure, equipment, site/side Location details: Posterior neck Anesthesia: Local infiltration with lidocaine 1% with epinephrine Anesthetic total: 1 mL Description: Patient presented with abscess on posterior neck.  Small amount of purulent material was expressed.  Abscess was explored using a cotton Q-tip. Cavity with depth of about 0.5 cm.  Wound was irrigated with sterile normal saline.  This was covered with 4 x 4 gauze and paper tape to protect the area and allow for continued drainage.  Patient was instructed on proper care and precautions.  Blood loss minimal.  Patient tolerated procedure extremely well with no immediate complications.  Independent interpretation of notes and tests performed by another provider:   None.  Brief History, Exam, Impression, and Recommendations:    BP 98/71 (BP Location: Right Arm, Patient Position: Sitting, Cuff Size: Normal)   Pulse 78   Ht 5\' 6"  (1.676 m)   Wt 125 lb 6.4 oz (56.9 kg)   SpO2 100%   BMI 20.24 kg/m   Abscess Patient reports that he has been doing well since last visit.  He did continue to have some drainage of the area.  Has been taking antibiotic as prescribed, denies any side effects or issues with medication.  Still has 1 antibiotic remaining. On exam, abscess appears to be smaller in size compared to initial presentation.  Still with some mild erythema overlying abscess.  Area is healed with no drainage expressed on exam, some fluctuance noted Discussed that given persistent swelling, fluctuance, feel that the incision will be needed in order to allow for further drainage of the area, patient amenable to proceeding with this today.  See procedure note above. Will plan for follow-up at previously scheduled appointment next week to monitor progress.   Given presence of swelling prior to recent infection, feel that further evaluation with dermatology would be appropriate, referral placed today  Return if symptoms worsen or fail to improve.   ___________________________________________ Antero Derosia de [place], MD, ABFM, [facility] Primary Care and Sports Medicine [facility]

## 2022-07-20 ENCOUNTER — Ambulatory Visit (INDEPENDENT_AMBULATORY_CARE_PROVIDER_SITE_OTHER): Payer: 59 | Admitting: Family Medicine

## 2022-07-20 ENCOUNTER — Encounter (HOSPITAL_BASED_OUTPATIENT_CLINIC_OR_DEPARTMENT_OTHER): Payer: Self-pay | Admitting: Family Medicine

## 2022-07-20 VITALS — BP 102/67 | HR 74 | Ht 66.0 in | Wt 127.0 lb

## 2022-07-20 DIAGNOSIS — L0291 Cutaneous abscess, unspecified: Secondary | ICD-10-CM

## 2022-07-20 DIAGNOSIS — Z794 Long term (current) use of insulin: Secondary | ICD-10-CM | POA: Diagnosis not present

## 2022-07-20 DIAGNOSIS — E1121 Type 2 diabetes mellitus with diabetic nephropathy: Secondary | ICD-10-CM | POA: Diagnosis not present

## 2022-07-20 LAB — HEMOGLOBIN A1C
Est. average glucose Bld gHb Est-mCnc: 243 mg/dL
Hgb A1c MFr Bld: 10.1 % — ABNORMAL HIGH (ref 4.8–5.6)

## 2022-07-20 NOTE — Progress Notes (Unsigned)
    Procedures performed today:    None.  Independent interpretation of notes and tests performed by another provider:   None.  Brief History, Exam, Impression, and Recommendations:    BP 102/67 (BP Location: Left Arm, Patient Position: Sitting, Cuff Size: Normal)   Pulse 74   Ht 5\' 6"  (1.676 m)   Wt 127 lb (57.6 kg)   SpO2 100%   BMI 20.50 kg/m   No problem-specific Assessment & Plan notes found for this encounter.  No follow-ups on file. Up on insulin by 2 units  ___________________________________________ Christopher Cirilo de [place], MD, ABFM, [facility] Primary Care and Sports Medicine [facility]

## 2022-07-20 NOTE — Patient Instructions (Signed)
  Medication Instructions:  Your physician recommends that you continue on your current medications as directed. Please refer to the Current Medication list given to you today. --If you need a refill on any your medications before your next appointment, please call your pharmacy first. If no refills are authorized on file call the office.-- Lab Work: Your physician has recommended that you have lab work today: Yes If you have labs (blood work) drawn today and your tests are completely normal, you will receive your results via MyChart message OR a phone call from our staff.  Please ensure you check your voicemail in the event that you authorized detailed messages to be left on a delegated number. If you have any lab test that is abnormal or we need to change your treatment, we will call you to review the results.  Referrals/Procedures/Imaging: Yes  Follow-Up: Your next appointment:   Your physician recommends that you schedule a follow-up appointment in: 3 months with Dr. de [place]  You will receive a text message or e-mail with a link to a survey about your care and experience with us today! We would greatly appreciate your feedback!   Thanks for letting us be apart of your health journey!!  Primary Care and Sports Medicine   Dr. Raymond de [place]   We encourage you to activate your patient portal called "MyChart".  Sign up information is provided on this After Visit Summary.  MyChart is used to connect with patients for Virtual Visits (Telemedicine).  Patients are able to view lab/test results, encounter notes, upcoming appointments, etc.  Non-urgent messages can be sent to your provider as well. To learn more about what you can do with MyChart, please visit --  [email or web address].    

## 2022-07-24 NOTE — Assessment & Plan Note (Signed)
Patient continues with Lantus, metformin, Jardiance, Ozempic.  Denies any issues at this time.  Most recent hemoglobin A1c did show increase since prior check.  He did report dietary indiscretions which may have contributed to this change.  Today, he indicates that he feels blood sugars have been better controlled some days.  Does report that he has had some days where fasting blood sugar will be greater than 200.  He denies any issues with polyuria or polydipsia. Patient is due for recheck of hemoglobin A1c at this time. Given that he has had some higher fasting blood sugar readings, feel that it would be reasonable to make slight adjustment in dose of insulin by increasing 2 units.  Patient amenable to this.  Otherwise, continue with current medication regimen.  Advised on adjusting diet given that dietary indiscretions have resulted in obvious elevations in fasting blood sugars next morning.

## 2022-07-24 NOTE — Assessment & Plan Note (Signed)
Patient previously referred to dermatology, he has not heard from them as of yet, we connected with dermatology office today to assist in arranging for evaluation.

## 2022-07-25 ENCOUNTER — Ambulatory Visit (INDEPENDENT_AMBULATORY_CARE_PROVIDER_SITE_OTHER): Payer: 59 | Admitting: Dermatology

## 2022-07-25 VITALS — BP 132/87 | HR 78

## 2022-07-25 DIAGNOSIS — L720 Epidermal cyst: Secondary | ICD-10-CM

## 2022-07-25 MED ORDER — TRIAMCINOLONE ACETONIDE 40 MG/ML IJ SUSP
40.0000 mg | Freq: Once | INTRAMUSCULAR | Status: DC
Start: 2022-07-25 — End: 2022-07-25

## 2022-07-25 MED ORDER — TRIAMCINOLONE ACETONIDE 10 MG/ML IJ SUSP
10.0000 mg | Freq: Once | INTRAMUSCULAR | Status: AC
Start: 2022-07-25 — End: 2022-07-25
  Administered 2022-07-25: 10 mg

## 2022-07-25 NOTE — Patient Instructions (Signed)
Due to recent changes in healthcare laws, you may see results of your pathology and/or laboratory studies on MyChart before the doctors have had a chance to review them. We understand that in some cases there may be results that are confusing or concerning to you. Please understand that not all results are received at the same time and often the doctors may need to interpret multiple results in order to provide you with the best plan of care or course of treatment. Therefore, we ask that you please give us 2 business days to thoroughly review all your results before contacting the office for clarification. Should we see a critical lab result, you will be contacted sooner.   If You Need Anything After Your Visit  If you have any questions or concerns for your doctor, please call our main line at [phone] If no one answers, please leave a voicemail as directed and we will return your call as soon as possible. Messages left after 4 pm will be answered the following business day.   You may also send us a message via MyChart. We typically respond to MyChart messages within 1-2 business days.  For prescription refills, please ask your pharmacy to contact our office. Our fax number is [phone].  If you have an urgent issue when the clinic is closed that cannot wait until the next business day, you can page your doctor at the number below.    Please note that while we do our best to be available for urgent issues outside of office hours, we are not available 24/7.   If you have an urgent issue and are unable to reach us, you may choose to seek medical care at your doctor's office, retail clinic, urgent care center, or emergency room.  If you have a medical emergency, please immediately call 911 or go to the emergency department. In the event of inclement weather, please call our main line at [phone] for an update on the status of any delays or closures.  Dermatology Medication Tips: Please  keep the boxes that topical medications come in in order to help keep track of the instructions about where and how to use these. Pharmacies typically print the medication instructions only on the boxes and not directly on the medication tubes.   If your medication is too expensive, please contact our office at [phone] or send us a message through MyChart.   We are unable to tell what your co-pay for medications will be in advance as this is different depending on your insurance coverage. However, we may be able to find a substitute medication at lower cost or fill out paperwork to get insurance to cover a needed medication.   If a prior authorization is required to get your medication covered by your insurance company, please allow us 1-2 business days to complete this process.  Drug prices often vary depending on where the prescription is filled and some pharmacies may offer cheaper prices.  The website www.goodrx.com contains coupons for medications through different pharmacies. The prices here do not account for what the cost may be with help from insurance (it may be cheaper with your insurance), but the website can give you the price if you did not use any insurance.  - You can print the associated coupon and take it with your prescription to the pharmacy.  - You may also stop by our office during regular business hours and pick up a GoodRx coupon card.  - If you need your   prescription sent electronically to a different pharmacy, notify our office through [place] MyChart or by phone at [phone]     

## 2022-07-25 NOTE — Progress Notes (Signed)
   New Patient Visit   Subjective  Christopher Cooper is a 62 y.o. male who presents for the following: Abcess  Patient states he  has abcess located at the posterior of Neck that he  would like to have examined. Patient reports the areas have been there for  several  year(s). He  reports the areas are bothersome. He states that the areas have not spread. Patient reports has previously been treated for these areas. Patient denies Hx of bx. Patient denies family history of skin cancer(s).    The following portions of the chart were reviewed this encounter and updated as appropriate: medications, allergies, medical history  Review of Systems:  No other skin or systemic complaints except as noted in HPI or Assessment and Plan.  Objective  Well appearing patient in no apparent distress; mood and affect are within normal limits.  A focused examination was performed of the following areas: Posterior Neck  Relevant exam findings are noted in the Assessment and Plan.      Assessment & Plan   Inflamed EPIDERMAL INCLUSION CYST Exam: Subcutaneous nodule at posterior neck  Benign-appearing. Exam most consistent with an epidermal inclusion cyst. Discussed that a cyst is a benign growth that can grow over time and sometimes get irritated or inflamed. Recommend observation if it is not bothersome. Discussed option of surgical excision to remove it if it is growing, symptomatic, or other changes noted. Please call for new or changing lesions so they can be evaluated.  Treatment: - Injected with Kenalog-40 - Will add to Dr.P[facility] list to have removed and discuss the cyst at the lower back  No follow-ups on file.  Documentation: I have reviewed the above documentation for accuracy and completeness, and I agree with the above.  Stasia Cavalier, am acting as scribe for Langston Reusing, DO.  Langston Reusing, DO

## 2022-08-01 ENCOUNTER — Ambulatory Visit: Payer: 59 | Admitting: Dermatology

## 2022-08-02 ENCOUNTER — Encounter (HOSPITAL_BASED_OUTPATIENT_CLINIC_OR_DEPARTMENT_OTHER): Payer: Self-pay

## 2022-08-02 ENCOUNTER — Ambulatory Visit (HOSPITAL_BASED_OUTPATIENT_CLINIC_OR_DEPARTMENT_OTHER): Payer: 59

## 2022-08-02 LAB — HM DIABETES EYE EXAM

## 2022-08-12 ENCOUNTER — Encounter: Payer: Self-pay | Admitting: Dermatology

## 2022-08-14 ENCOUNTER — Encounter (HOSPITAL_BASED_OUTPATIENT_CLINIC_OR_DEPARTMENT_OTHER): Payer: Self-pay | Admitting: *Deleted

## 2022-08-30 ENCOUNTER — Encounter (HOSPITAL_BASED_OUTPATIENT_CLINIC_OR_DEPARTMENT_OTHER): Payer: Self-pay | Admitting: Family Medicine

## 2022-08-30 ENCOUNTER — Ambulatory Visit (INDEPENDENT_AMBULATORY_CARE_PROVIDER_SITE_OTHER): Payer: 59 | Admitting: Family Medicine

## 2022-08-30 ENCOUNTER — Other Ambulatory Visit: Payer: Self-pay

## 2022-08-30 DIAGNOSIS — Z794 Long term (current) use of insulin: Secondary | ICD-10-CM

## 2022-08-30 DIAGNOSIS — E1121 Type 2 diabetes mellitus with diabetic nephropathy: Secondary | ICD-10-CM | POA: Diagnosis not present

## 2022-08-30 DIAGNOSIS — L720 Epidermal cyst: Secondary | ICD-10-CM

## 2022-08-30 MED ORDER — OZEMPIC (2 MG/DOSE) 8 MG/3ML ~~LOC~~ SOPN
PEN_INJECTOR | SUBCUTANEOUS | 5 refills | Status: DC
Start: 2022-08-30 — End: 2023-03-28

## 2022-08-30 MED ORDER — METOPROLOL SUCCINATE ER 25 MG PO TB24: 25.0000 mg | ORAL_TABLET | Freq: Every day | ORAL | 3 refills | Status: DC

## 2022-08-30 MED ORDER — ONETOUCH VERIO VI STRP: ORAL_STRIP | 5 refills | Status: DC

## 2022-08-30 NOTE — Patient Instructions (Signed)
  Medication Instructions:  Your physician recommends that you continue on your current medications as directed. Please refer to the Current Medication list given to you today. --If you need a refill on any your medications before your next appointment, please call your pharmacy first. If no refills are authorized on file call the office.-- Lab Work: Your physician has recommended that you have lab work today: No If you have labs (blood work) drawn today and your tests are completely normal, you will receive your results via [place] a phone call from our staff.  Please ensure you check your voicemail in the event that you authorized detailed messages to be left on a delegated number. If you have any lab test that is abnormal or we need to change your treatment, we will call you to review the results.  Referrals/Procedures/Imaging: No  Follow-Up: Your next appointment:   Your physician recommends that you schedule a follow-up appointment in: 8 weeks  with Dr. de [place].  You will receive a text message or e-mail with a link to a survey about your care and experience with [place] today! We would greatly appreciate your feedback!   Thanks for letting us be apart of your health journey!!  Primary Care and Sports Medicine   Dr. Arlina Robes [place]   We encourage you to activate your patient portal called "MyChart".  Sign up information is provided on this After Visit Summary.  MyChart is used to connect with patients for Virtual Visits (Telemedicine).  Patients are able to view lab/test results, encounter notes, upcoming appointments, etc.  Non-urgent messages can be sent to your provider as well. To learn more about what you can do with MyChart, please visit --  [email or web address].

## 2022-08-30 NOTE — Progress Notes (Signed)
    Procedures performed today:    None.  Independent interpretation of notes and tests performed by another provider:   None.  Brief History, Exam, Impression, and Recommendations:    BP 110/74 (BP Location: Left Arm, Patient Position: Sitting, Cuff Size: Normal)   Pulse 80   Ht 5\' 6"  (1.676 m)   Wt 124 lb 6.4 oz (56.4 kg)   SpO2 100%   BMI 20.08 kg/m   Type 2 diabetes mellitus with diabetic nephropathy, with long-term current use of insulin (HCC) Assessment & Plan: Patient reports that his blood sugars have generally been doing well, occasionally having some higher readings in the 200s.  He does have a blood sugar log with him today.  Generally, blood sugars are in the low to mid 100s.  He does have some occasional readings in the mid 200s.  He indicates that these are likely related to dietary indiscretions, particularly when he eats fast foods such as [employer]. Most recent hemoglobin A1c was notably elevated at 10.1%.  Patient has been gradually increasing his insulin dose and is currently administering 34 units of insulin daily.  Has not had any reported hypoglycemic episodes. We discussed considerations today.  Given generally good control of fasting blood sugars, would recommend continuing with current medication regimen, no changes to meds today.  Can continue with current insulin dose.  It is too soon for [place] to recheck hemoglobin A1c at this time.  We will plan to recheck in about 2 months and we will plan for follow-up visit at that time. Recommend focusing on lifestyle modifications, adjustments to diet with general avoidance/moderation of certain foods and beverages, particularly if finding that morning fasting blood sugar is notably abnormal.  Orders: -     Ozempic (2 MG/DOSE); Inject 2 mg subcutaneous once a week  Dispense: 3 mL; Refill: 5  Other orders -     Metoprolol Succinate ER; Take 1 tablet (25 mg total) by mouth daily.  Dispense: 90 tablet; Refill: 3 -     OneTouch  Verio; Use as instructed  Dispense: 100 each; Refill: 5  Return in about 8 weeks (around 10/25/2022).   ___________________________________________ Otoniel Myhand de [place], MD, ABFM, CAQSM Primary Care and Sports Medicine [facility]

## 2022-08-30 NOTE — Assessment & Plan Note (Signed)
Patient reports that his blood sugars have generally been doing well, occasionally having some higher readings in the 200s.  He does have a blood sugar log with him today.  Generally, blood sugars are in the low to mid 100s.  He does have some occasional readings in the mid 200s.  He indicates that these are likely related to dietary indiscretions, particularly when he eats fast foods such as [employer]. Most recent hemoglobin A1c was notably elevated at 10.1%.  Patient has been gradually increasing his insulin dose and is currently administering 34 units of insulin daily.  Has not had any reported hypoglycemic episodes. We discussed considerations today.  Given generally good control of fasting blood sugars, would recommend continuing with current medication regimen, no changes to meds today.  Can continue with current insulin dose.  It is too soon for [place] to recheck hemoglobin A1c at this time.  We will plan to recheck in about 2 months and we will plan for follow-up visit at that time. Recommend focusing on lifestyle modifications, adjustments to diet with general avoidance/moderation of certain foods and beverages, particularly if finding that morning fasting blood sugar is notably abnormal.

## 2022-09-19 ENCOUNTER — Other Ambulatory Visit: Payer: Self-pay | Admitting: Adult Health

## 2022-09-25 ENCOUNTER — Other Ambulatory Visit (HOSPITAL_BASED_OUTPATIENT_CLINIC_OR_DEPARTMENT_OTHER): Payer: Self-pay | Admitting: Family Medicine

## 2022-09-25 DIAGNOSIS — Z794 Long term (current) use of insulin: Secondary | ICD-10-CM

## 2022-10-25 ENCOUNTER — Encounter (HOSPITAL_BASED_OUTPATIENT_CLINIC_OR_DEPARTMENT_OTHER): Payer: Self-pay | Admitting: Family Medicine

## 2022-10-25 ENCOUNTER — Ambulatory Visit (INDEPENDENT_AMBULATORY_CARE_PROVIDER_SITE_OTHER): Payer: 59 | Admitting: Family Medicine

## 2022-10-25 VITALS — BP 112/72 | HR 78 | Temp 98.6°F | Ht 66.0 in | Wt 128.1 lb

## 2022-10-25 DIAGNOSIS — E1165 Type 2 diabetes mellitus with hyperglycemia: Secondary | ICD-10-CM

## 2022-10-25 DIAGNOSIS — Z23 Encounter for immunization: Secondary | ICD-10-CM | POA: Diagnosis not present

## 2022-10-25 DIAGNOSIS — Z794 Long term (current) use of insulin: Secondary | ICD-10-CM | POA: Diagnosis not present

## 2022-10-25 DIAGNOSIS — Z0000 Encounter for general adult medical examination without abnormal findings: Secondary | ICD-10-CM

## 2022-10-25 DIAGNOSIS — E1121 Type 2 diabetes mellitus with diabetic nephropathy: Secondary | ICD-10-CM

## 2022-10-25 MED ORDER — ATORVASTATIN CALCIUM 80 MG PO TABS: 80.0000 mg | ORAL_TABLET | Freq: Every day | ORAL | 1 refills | Status: DC

## 2022-10-25 MED ORDER — LANTUS SOLOSTAR 100 UNIT/ML ~~LOC~~ SOPN
38.0000 [IU] | PEN_INJECTOR | Freq: Every day | SUBCUTANEOUS | 3 refills | Status: DC
Start: 2022-10-25 — End: 2023-03-28

## 2022-10-25 NOTE — Progress Notes (Signed)
    Procedures performed today:    None.  Independent interpretation of notes and tests performed by another provider:   None.  Brief History, Exam, Impression, and Recommendations:    BP 112/72 (BP Location: Left Arm, Patient Position: Sitting, Cuff Size: Normal)   Pulse 78   Temp 98.6 F (37 C)   Ht 5\' 6"  (1.676 m)   Wt 128 lb 1.6 oz (58.1 kg)   SpO2 100%   BMI 20.68 kg/m   Encounter for immunization -     Flu vaccine trivalent PF, 6mos and older(Flulaval,Afluria,Fluarix,Fluzone)  Type 2 diabetes mellitus with hyperglycemia, with long-term current use of insulin (HCC) Assessment & Plan: Patient reports that his blood sugars have generally been doing well, average in low 100s.  He does report having infrequent low blood sugar readings, recently had blood sugar reading around 4 AM that was in the 60s.  Did have mild symptoms with this.  Last low blood sugar reading which triggered symptoms was about 4 weeks ago.  He does have a blood sugar log with him today. Most recent hemoglobin A1c was notably elevated at 10.1%.  Patient has been gradually increasing his insulin dose and is currently administering 38 units of insulin daily. We discussed considerations today.  Given generally good control of fasting blood sugars, would recommend continuing with current medication regimen, no changes to meds today.  Can continue with current insulin dose.  We will check hemoglobin A1c today as well as BMP and urine microalbumin/creatinine ratio Recommend focusing on lifestyle modifications, adjustments to diet with general avoidance/moderation of certain foods and beverages, particularly if finding that morning fasting blood sugar is notably abnormal.  Orders: -     Hemoglobin A1c -     Basic metabolic panel -     Microalbumin / creatinine urine ratio  Type 2 diabetes mellitus with diabetic nephropathy, with long-term current use of insulin (HCC) Assessment & Plan: Patient reports that his  blood sugars have generally been doing well, average in low 100s.  He does report having infrequent low blood sugar readings, recently had blood sugar reading around 4 AM that was in the 60s.  Did have mild symptoms with this.  Last low blood sugar reading which triggered symptoms was about 4 weeks ago.  He does have a blood sugar log with him today. Most recent hemoglobin A1c was notably elevated at 10.1%.  Patient has been gradually increasing his insulin dose and is currently administering 38 units of insulin daily. We discussed considerations today.  Given generally good control of fasting blood sugars, would recommend continuing with current medication regimen, no changes to meds today.  Can continue with current insulin dose.  We will check hemoglobin A1c today as well as BMP and urine microalbumin/creatinine ratio Recommend focusing on lifestyle modifications, adjustments to diet with general avoidance/moderation of certain foods and beverages, particularly if finding that morning fasting blood sugar is notably abnormal.  Orders: -     Lantus SoloStar; Inject 38 Units into the skin daily.  Dispense: 15 mL; Refill: 3  Other orders -     Atorvastatin Calcium; Take 1 tablet (80 mg total) by mouth daily.  Dispense: 90 tablet; Refill: 1  Return in about 3 months (around 01/24/2023) for CPE with fasting labs 1 week prior.   ___________________________________________ Lamiya Naas de [place], MD, ABFM, CAQSM Primary Care and Sports Medicine [facility]

## 2022-10-25 NOTE — Assessment & Plan Note (Signed)
Patient reports that his blood sugars have generally been doing well, average in low 100s.  He does report having infrequent low blood sugar readings, recently had blood sugar reading around 4 AM that was in the 60s.  Did have mild symptoms with this.  Last low blood sugar reading which triggered symptoms was about 4 weeks ago.  He does have a blood sugar log with him today. Most recent hemoglobin A1c was notably elevated at 10.1%.  Patient has been gradually increasing his insulin dose and is currently administering 38 units of insulin daily. We discussed considerations today.  Given generally good control of fasting blood sugars, would recommend continuing with current medication regimen, no changes to meds today.  Can continue with current insulin dose.  We will check hemoglobin A1c today as well as BMP and urine microalbumin/creatinine ratio Recommend focusing on lifestyle modifications, adjustments to diet with general avoidance/moderation of certain foods and beverages, particularly if finding that morning fasting blood sugar is notably abnormal.

## 2022-10-26 LAB — BASIC METABOLIC PANEL
BUN/Creatinine Ratio: 10 (ref 10–24)
BUN: 14 mg/dL (ref 8–27)
CO2: 21 mmol/L (ref 20–29)
Calcium: 9.3 mg/dL (ref 8.6–10.2)
Chloride: 101 mmol/L (ref 96–106)
Creatinine, Ser: 1.35 mg/dL — ABNORMAL HIGH (ref 0.76–1.27)
Glucose: 147 mg/dL — ABNORMAL HIGH (ref 70–99)
Potassium: 4.4 mmol/L (ref 3.5–5.2)
Sodium: 138 mmol/L (ref 134–144)
eGFR: 60 mL/min/{1.73_m2} (ref >59)

## 2022-10-26 LAB — MICROALBUMIN / CREATININE URINE RATIO
Creatinine, Urine: 61.4 mg/dL
Microalb/Creat Ratio: 9 mg/g{creat} (ref 0–29)
Microalbumin, Urine: 5.5 ug/mL

## 2022-10-26 LAB — HEMOGLOBIN A1C
Est. average glucose Bld gHb Est-mCnc: 217 mg/dL
Hgb A1c MFr Bld: 9.2 % — ABNORMAL HIGH (ref 4.8–5.6)

## 2022-11-02 ENCOUNTER — Other Ambulatory Visit (HOSPITAL_BASED_OUTPATIENT_CLINIC_OR_DEPARTMENT_OTHER): Payer: Self-pay | Admitting: Family Medicine

## 2022-11-02 DIAGNOSIS — R7989 Other specified abnormal findings of blood chemistry: Secondary | ICD-10-CM

## 2022-11-02 DIAGNOSIS — E1165 Type 2 diabetes mellitus with hyperglycemia: Secondary | ICD-10-CM

## 2022-11-05 ENCOUNTER — Encounter (HOSPITAL_BASED_OUTPATIENT_CLINIC_OR_DEPARTMENT_OTHER): Payer: Self-pay | Admitting: *Deleted

## 2022-11-26 ENCOUNTER — Other Ambulatory Visit (HOSPITAL_BASED_OUTPATIENT_CLINIC_OR_DEPARTMENT_OTHER): Payer: 59

## 2022-11-29 ENCOUNTER — Other Ambulatory Visit (HOSPITAL_BASED_OUTPATIENT_CLINIC_OR_DEPARTMENT_OTHER): Payer: 59

## 2022-11-29 DIAGNOSIS — Z0000 Encounter for general adult medical examination without abnormal findings: Secondary | ICD-10-CM

## 2022-11-29 DIAGNOSIS — R7989 Other specified abnormal findings of blood chemistry: Secondary | ICD-10-CM

## 2022-11-30 LAB — CBC WITH DIFFERENTIAL/PLATELET
Basophils Absolute: 0.1 10*3/uL (ref 0.0–0.2)
Basos: 1 %
EOS (ABSOLUTE): 0.3 10*3/uL (ref 0.0–0.4)
Eos: 3 %
Hematocrit: 48.9 % (ref 37.5–51.0)
Hemoglobin: 15.6 g/dL (ref 13.0–17.7)
Immature Grans (Abs): 0 10*3/uL (ref 0.0–0.1)
Immature Granulocytes: 0 %
Lymphocytes Absolute: 3.8 10*3/uL — ABNORMAL HIGH (ref 0.7–3.1)
Lymphs: 28 %
MCH: 29.4 pg (ref 26.6–33.0)
MCHC: 31.9 g/dL (ref 31.5–35.7)
MCV: 92 fL (ref 79–97)
Monocytes Absolute: 0.8 10*3/uL (ref 0.1–0.9)
Monocytes: 6 %
Neutrophils Absolute: 8.7 10*3/uL — ABNORMAL HIGH (ref 1.4–7.0)
Neutrophils: 62 %
Platelets: 241 10*3/uL (ref 150–450)
RBC: 5.31 x10E6/uL (ref 4.14–5.80)
RDW: 12.3 % (ref 11.6–15.4)
WBC: 13.9 10*3/uL — ABNORMAL HIGH (ref 3.4–10.8)

## 2022-11-30 LAB — LIPID PANEL
Chol/HDL Ratio: 1.7 {ratio} (ref 0.0–5.0)
Cholesterol, Total: 65 mg/dL — ABNORMAL LOW (ref 100–199)
HDL: 38 mg/dL — ABNORMAL LOW (ref >39)
LDL Chol Calc (NIH): 3 mg/dL (ref 0–99)
Triglycerides: 140 mg/dL (ref 0–149)
VLDL Cholesterol Cal: 24 mg/dL (ref 5–40)

## 2022-11-30 LAB — COMPREHENSIVE METABOLIC PANEL
ALT: 32 [IU]/L (ref 0–44)
AST: 26 [IU]/L (ref 0–40)
Albumin: 4.8 g/dL (ref 3.8–4.9)
Alkaline Phosphatase: 98 [IU]/L (ref 44–121)
BUN/Creatinine Ratio: 15 (ref 10–24)
BUN: 20 mg/dL (ref 8–27)
Bilirubin Total: 1.1 mg/dL (ref 0.0–1.2)
CO2: 21 mmol/L (ref 20–29)
Calcium: 9.6 mg/dL (ref 8.6–10.2)
Chloride: 97 mmol/L (ref 96–106)
Creatinine, Ser: 1.34 mg/dL — ABNORMAL HIGH (ref 0.76–1.27)
Globulin, Total: 2.3 g/dL (ref 1.5–4.5)
Glucose: 177 mg/dL — ABNORMAL HIGH (ref 70–99)
Potassium: 4.2 mmol/L (ref 3.5–5.2)
Sodium: 138 mmol/L (ref 134–144)
Total Protein: 7.1 g/dL (ref 6.0–8.5)
eGFR: 61 mL/min/{1.73_m2} (ref >59)

## 2022-11-30 LAB — HEMOGLOBIN A1C
Est. average glucose Bld gHb Est-mCnc: 220 mg/dL
Hgb A1c MFr Bld: 9.3 % — ABNORMAL HIGH (ref 4.8–5.6)

## 2022-11-30 LAB — TSH RFX ON ABNORMAL TO FREE T4: TSH: 2.6 u[IU]/mL (ref 0.450–4.500)

## 2022-12-06 ENCOUNTER — Ambulatory Visit: Payer: 59 | Admitting: Dermatology

## 2022-12-06 ENCOUNTER — Encounter: Payer: Self-pay | Admitting: Dermatology

## 2022-12-06 VITALS — BP 107/67 | HR 90

## 2022-12-06 DIAGNOSIS — L720 Epidermal cyst: Secondary | ICD-10-CM | POA: Diagnosis not present

## 2022-12-06 DIAGNOSIS — D485 Neoplasm of uncertain behavior of skin: Secondary | ICD-10-CM

## 2022-12-06 DIAGNOSIS — D492 Neoplasm of unspecified behavior of bone, soft tissue, and skin: Secondary | ICD-10-CM

## 2022-12-06 NOTE — Progress Notes (Addendum)
Follow-Up Visit   Subjective  Christopher Cooper is a 62 y.o. male who presents for the following: Excision of neoplasm of skin posterior neck  The following portions of the chart were reviewed this encounter and updated as appropriate: medications, allergies, medical history  Review of Systems:  No other skin or systemic complaints except as noted in HPI or Assessment and Plan.  Objective  Well appearing patient in no apparent distress; mood and affect are within normal limits.  A focused examination was performed of the following areas: Posterior neck Relevant physical exam findings are noted in the Assessment and Plan.   posterior neck           Assessment & Plan   Neoplasm of uncertain behavior of skin posterior neck  Skin excision  Lesion length (cm):  2 Lesion width (cm):  1.2 Margin per side (cm):  0.1 Total excision diameter (cm):  2.2 Informed consent: discussed and consent obtained   Timeout: patient name, date of birth, surgical site, and procedure verified   Procedure prep:  Patient was prepped and draped in usual sterile fashion Prep type:  Chlorhexidine Anesthesia: the lesion was anesthetized in a standard fashion   Anesthetic:  1% lidocaine w/ epinephrine 1-100,000 local infiltration Instrument used: #15 blade   Hemostasis achieved with: suture, pressure and electrodesiccation   Hemostasis achieved with comment:  4.0 vicryl,5.0 fast absorbing Outcome: patient tolerated procedure well with no complications   Post-procedure details: sterile dressing applied and wound care instructions given    Skin repair Complexity:  Complex Final length (cm):  4 Informed consent: discussed and consent obtained   Timeout: patient name, date of birth, surgical site, and procedure verified   Procedure prep:  Patient was prepped and draped in usual sterile fashion Prep type:  Chlorhexidine Anesthesia: the lesion was anesthetized in a standard fashion   Anesthetic:   1% lidocaine w/ epinephrine 1-100,000 local infiltration Reason for type of repair: reduce tension to allow closure, reduce the risk of dehiscence, infection, and necrosis, reduce subcutaneous dead space and avoid a hematoma, allow closure of the large defect and preserve normal anatomy   Undermining: area extensively undermined   Subcutaneous layers (deep stitches):  Suture size:  4-0 Suture type: Vicryl (polyglactin 910)   Stitches:  Buried vertical mattress Fine/surface layer approximation (top stitches):  Suture size:  5-0 Suture type: fast-absorbing plain gut   Stitches: simple running   Hemostasis achieved with: suture, pressure and electrodesiccation Outcome: patient tolerated procedure well with no complications   Post-procedure details: sterile dressing applied and wound care instructions given   Dressing type: pressure dressing and bacitracin     Return if symptoms worsen or fail to improve.  The surgical wound was then cleaned, prepped, and re-anesthetized as above. Wound edges were undermined extensively along at least one entire edge and at a distance equal to or greater than the width of the defect (see wound defect size above) in order to achieve closure and decrease wound tension and anatomic distortion. Redundant tissue repair including standing cone removal was performed. Hemostasis was achieved with electrocautery. Subcutaneous and epidermal tissues were approximated with the above sutures. The surgical site was then lightly scrubbed with sterile, saline-soaked gauze. The area was then bandaged using Vaseline ointment, non-adherent gauze, gauze pads, and tape to provide an adequate pressure dressing. The patient tolerated the procedure well, was given detailed written and verbal wound care instructions, and was discharged in good condition.   The patient will  follow-up: PRN.   I, Tillie Fantasia, CMA, am acting as scribe for Gwenith Daily, MD.   Documentation: I have  reviewed the above documentation for accuracy and completeness, and I agree with the above.  Gwenith Daily, MD

## 2022-12-06 NOTE — Patient Instructions (Signed)

## 2022-12-10 LAB — SURGICAL PATHOLOGY

## 2023-01-17 ENCOUNTER — Other Ambulatory Visit (HOSPITAL_BASED_OUTPATIENT_CLINIC_OR_DEPARTMENT_OTHER): Payer: 59

## 2023-01-17 LAB — HEMOGLOBIN A1C
Est. average glucose Bld gHb Est-mCnc: 217 mg/dL
Hgb A1c MFr Bld: 9.2 % — ABNORMAL HIGH (ref 4.8–5.6)

## 2023-01-24 ENCOUNTER — Encounter (HOSPITAL_BASED_OUTPATIENT_CLINIC_OR_DEPARTMENT_OTHER): Payer: Self-pay | Admitting: Family Medicine

## 2023-01-24 ENCOUNTER — Ambulatory Visit (INDEPENDENT_AMBULATORY_CARE_PROVIDER_SITE_OTHER): Payer: 59 | Admitting: Family Medicine

## 2023-01-24 ENCOUNTER — Ambulatory Visit: Payer: 59 | Admitting: Dermatology

## 2023-01-24 ENCOUNTER — Encounter: Payer: Self-pay | Admitting: Dermatology

## 2023-01-24 VITALS — BP 110/74 | HR 77 | Ht 66.0 in | Wt 129.5 lb

## 2023-01-24 VITALS — BP 106/71 | HR 80

## 2023-01-24 DIAGNOSIS — L720 Epidermal cyst: Secondary | ICD-10-CM

## 2023-01-24 DIAGNOSIS — L723 Sebaceous cyst: Secondary | ICD-10-CM

## 2023-01-24 DIAGNOSIS — Z0000 Encounter for general adult medical examination without abnormal findings: Secondary | ICD-10-CM

## 2023-01-24 MED ORDER — TRIAMCINOLONE ACETONIDE 40 MG/ML IJ SUSP
40.0000 mg | Freq: Once | INTRAMUSCULAR | Status: AC
Start: 2023-01-24 — End: 2023-01-24
  Administered 2023-01-24: 40 mg via INTRAMUSCULAR

## 2023-01-24 NOTE — Progress Notes (Signed)
Subjective:    CC: Annual Physical Exam  HPI:  Christopher Cooper is a 62 y.o. presenting for annual physical  I reviewed the past medical history, family history, social history, surgical history, and allergies today and no changes were needed.  Please see the problem list section below in epic for further details.  Past Medical History: Past Medical History:  Diagnosis Date   Asbestos exposure    CAD in native artery    Diabetes (HCC)    GERD (gastroesophageal reflux disease)    Hyperlipidemia LDL goal <70    Hypertension    S/P angioplasty with stent, 11/25/15 to mRCA to dRCA promus premier DES 11/26/2015   Past Surgical History: Past Surgical History:  Procedure Laterality Date   ANKLE ARTHROSCOPY Left    after injury   CARDIAC CATHETERIZATION N/A 11/25/2015   Procedure: Left Heart Cath and Coronary Angiography;  Surgeon: Peter M Swaziland, MD;  Location: [facility] INVASIVE CV LAB;  Service: Cardiovascular;  Laterality: N/A;   CARDIAC CATHETERIZATION N/A 11/25/2015   Procedure: Coronary Stent Intervention;  Surgeon: Peter M Swaziland, MD;  Location: [facility] INVASIVE CV LAB;  Service: Cardiovascular;  Laterality: N/A;   CORONARY ANGIOPLASTY WITH STENT PLACEMENT     Social History: Social History   Socioeconomic History   Marital status: Married    Spouse name: Not on file   Number of children: Not on file   Years of education: Not on file   Highest education level: Not on file  Occupational History   Occupation: Social research officer, government at [employer]  Tobacco Use   Smoking status: Former    Current packs/day: 0.25    Types: Cigarettes    Passive exposure: Past   Smokeless tobacco: Never  Vaping Use   Vaping status: Never Used  Substance and Sexual Activity   Alcohol use: No   Drug use: No   Sexual activity: Yes    Birth control/protection: Other-see comments  Other Topics Concern   Not on file  Social History Narrative   He lives in [place] with his wife.   Social Drivers of  Corporate investment banker Strain: Low Risk  (10/25/2022)   Overall Financial Resource Strain (CARDIA)    Difficulty of Paying Living Expenses: Not hard at all  Food Insecurity: No Food Insecurity (10/25/2022)   Hunger Vital Sign    Worried About Running Out of Food in the Last Year: Never true    Ran Out of Food in the Last Year: Never true  Transportation Needs: No Transportation Needs (10/25/2022)   PRAPARE - Administrator, Civil Service (Medical): No    Lack of Transportation (Non-Medical): No  Physical Activity: Insufficiently Active (10/25/2022)   Exercise Vital Sign    Days of Exercise per Week: 4 days    Minutes of Exercise per Session: 30 min  Stress: No Stress Concern Present (10/25/2022)   [employer] of Occupational Health - Occupational Stress Questionnaire    Feeling of Stress : Not at all  Social Connections: Moderately Integrated (10/25/2022)   Social Connection and Isolation Panel [NHANES]    Frequency of Communication with Friends and Family: Twice a week    Frequency of Social Gatherings with Friends and Family: Once a week    Attends Religious Services: 1 to 4 times per year    Active Member of [employer] or Organizations: No    Attends Banker Meetings: Never    Marital Status: Married   Family  History: Family History  Problem Relation Age of Onset   Diabetes Mellitus II Mother    Diabetes Mellitus II Father    CAD Other        Reports multiple distant relatives with history of MI   Allergies: No Known Allergies Medications: See med rec.  Review of Systems: No headache, visual changes, nausea, vomiting, diarrhea, constipation, dizziness, abdominal pain, skin rash, fevers, chills, night sweats, swollen lymph nodes, weight loss, chest pain, body aches, joint swelling, muscle aches, shortness of breath, mood changes, visual or auditory hallucinations.  Objective:    BP 110/74 (BP Location: Right Arm, Patient Position: Sitting, Cuff  Size: Normal)   Pulse 77   Ht 5\' 6"  (1.676 m)   Wt 129 lb 8 oz (58.7 kg)   SpO2 100%   BMI 20.90 kg/m   General: Well Developed, well nourished, and in no acute distress.  Neuro: Alert and oriented x3, extra-ocular muscles intact, sensation grossly intact. Cranial nerves II through XII are intact, motor, sensory, and coordinative functions are all intact. HEENT: Normocephalic, atraumatic, pupils equal round reactive to light, neck supple, no masses, no lymphadenopathy, thyroid nonpalpable. Oropharynx, nasopharynx, external ear canals are unremarkable. Skin: Warm and dry, no rashes noted.  Cardiac: Regular rate and rhythm, no murmurs rubs or gallops.  Respiratory: Clear to auscultation bilaterally. Not using accessory muscles, speaking in full sentences.  Abdominal: Soft, nontender, nondistended, positive bowel sounds, no masses, no organomegaly.  Musculoskeletal: Shoulder, elbow, wrist, hip, knee, ankle stable, and with full range of motion.  Impression and Recommendations:    Wellness examination Assessment & Plan: Routine HCM labs reviewed. HCM reviewed/discussed. Anticipatory guidance regarding healthy weight, lifestyle and choices given. Recommend healthy diet.  Recommend approximately 150 minutes/week of moderate intensity exercise Recommend regular dental and vision exams Always use seatbelt/lap and shoulder restraints Recommend using smoke alarms and checking batteries at least twice a year Recommend using sunscreen when outside Discussed colon cancer screening recommendations, options.  Patient reports having completed Cologuard with prior PCP, this was in February 2022, he reports that findings were negative, health maintenance updated. Discussed recommendations for shingles vaccine Discussed tetanus immunization recommendations, patient is UTD   Return in about 3 months (around 04/24/2023) for diabetes.   ___________________________________________ Daniyal Tabor de [place], MD,  ABFM, CAQSM Primary Care and Sports Medicine [facility]

## 2023-01-24 NOTE — Patient Instructions (Signed)
?  Medication Instructions:  ?Your physician recommends that you continue on your current medications as directed. Please refer to the Current Medication list given to you today. ?--If you need a refill on any your medications before your next appointment, please call your pharmacy first. If no refills are authorized on file call the office.-- ? ? ? ?Follow-Up: ?Your next appointment:   ?Your physician recommends that you schedule a follow-up appointment in: 3 month follow-up with Dr. de [place] ? ?You will receive a text message or e-mail with a link to a survey about your care and experience with [place] today! We would greatly appreciate your feedback!  ? ?Thanks for letting us be apart of your health journey!!  ?Primary Care and Sports Medicine  ? ?Dr. Kyung Rudd de [place]  ? ?We encourage you to activate your patient portal called "MyChart".  Sign up information is provided on this After Visit Summary.  MyChart is used to connect with patients for Virtual Visits (Telemedicine).  Patients are able to view lab/test results, encounter notes, upcoming appointments, etc.  Non-urgent messages can be sent to your provider as well. To learn more about what you can do with MyChart, please visit --  [email or web address].    ?

## 2023-01-24 NOTE — Progress Notes (Signed)
   Follow-Up Visit   Subjective  Christopher Cooper is a 62 y.o. male who presents for the following: Cyst  Patient present today for follow up visit for cyst located at gluteal cleft. Patient was last evaluated on 12/06/22 with Dr. Caralyn Guile for Ex of EIC posterior neck. Patient reports sxs are better. Patient denies medication changes.  The following portions of the chart were reviewed this encounter and updated as appropriate: medications, allergies, medical history  Review of Systems:  No other skin or systemic complaints except as noted in HPI or Assessment and Plan.  Objective  Well appearing patient in no apparent distress; mood and affect are within normal limits.   A focused examination was performed of the following areas: cyst   Relevant exam findings are noted in the Assessment and Plan.         Gluteal Crease 1cm subcutaneous nodule.INFLAMEDCYST   Assessment & Plan   EPIDERMAL INCLUSION CYST Exam: Subcutaneous nodule at gluteal cleft  Benign-appearing. Exam most consistent with an epidermal inclusion cyst. Discussed that a cyst is a benign growth that can grow over time and sometimes get irritated or inflamed. Recommend observation if it is not bothersome. Discussed option of surgical excision to remove it if it is growing, symptomatic, or other changes noted. Please call for new or changing lesions so they can be evaluated.     EPIDERMAL CYST Gluteal Crease Intralesional injection - Gluteal Crease Procedure Note Intralesional Injection  Location: gluteal cleft  Informed Consent: Discussed risks (infection, pain, bleeding, bruising, thinning of the skin, loss of skin pigment, lack of resolution, and recurrence of lesion) and benefits of the procedure, as well as the alternatives. Informed consent was obtained. Preparation: The area was prepared a standard fashion.  Anesthesia:n/a  Procedure Details: An intralesional injection was performed with Kenalog 40 mg/cc.  .15 cc in total were injected. NDC #: 4098-1191-47 Exp: 06/12/24  Total number of injections: 1  Plan: The patient was instructed on post-op care. Recommend OTC analgesia as needed for pain.   No follow-ups on file.    Documentation: I have reviewed the above documentation for accuracy and completeness, and I agree with the above.   I, Shirron Marcha Solders, CMA, am acting as scribe for [employer], DO.   Langston Reusing, DO

## 2023-01-24 NOTE — Assessment & Plan Note (Signed)
Routine HCM labs reviewed. HCM reviewed/discussed. Anticipatory guidance regarding healthy weight, lifestyle and choices given. Recommend healthy diet.  Recommend approximately 150 minutes/week of moderate intensity exercise Recommend regular dental and vision exams Always use seatbelt/lap and shoulder restraints Recommend using smoke alarms and checking batteries at least twice a year Recommend using sunscreen when outside Discussed colon cancer screening recommendations, options.  Patient reports having completed Cologuard with prior PCP, this was in February 2022, he reports that findings were negative, health maintenance updated. Discussed recommendations for shingles vaccine Discussed tetanus immunization recommendations, patient is UTD

## 2023-01-24 NOTE — Patient Instructions (Addendum)
Hello Christopher Cooper,  Thank you for visiting my office today. Your dedication to addressing your health concerns is greatly appreciated, and I'm pleased we could discuss the management of your cystic nodule. Here is a summary of the key instructions from today's visit:  - Treatment Administered: You received an injection of Kenalog 40, with a dosage of 0.15 cc, aimed at reducing the inflammation and size of the cystic nodule.  - Post-Treatment Care: It's important to avoid touching the treated area to prevent irritation as the fluid dissipates over the next 12 hours.  - Follow-Up: Should the cyst cause further concern, please contact the office to schedule an   excision with Dr. Caralyn Guile.   - Observation: Keep an eye on the cyst for any changes in size or discomfort, and report any significant changes back to [place].  We're here to support you through your health journey, so please don't hesitate to reach out if you have any further questions or concerns.  Best regards,  Dr. Langston [facility] Important Information  Due to recent changes in healthcare laws, you may see results of your pathology and/or laboratory studies on MyChart before the doctors have had a chance to review them. We understand that in some cases there may be results that are confusing or concerning to you. Please understand that not all results are received at the same time and often the doctors may need to interpret multiple results in order to provide you with the best plan of care or course of treatment. Therefore, we ask that you please give [place] 2 business days to thoroughly review all your results before contacting the office for clarification. Should we see a critical lab result, you will be contacted sooner.   If You Need Anything After Your Visit  If you have any questions or concerns for your doctor, please call our main line at [phone] If no one answers, please leave a voicemail as directed and we will return your  call as soon as possible. Messages left after 4 pm will be answered the following business day.   You may also send [place] a message via MyChart. We typically respond to MyChart messages within 1-2 business days.  For prescription refills, please ask your pharmacy to contact our office. Our fax number is [phone].  If you have an urgent issue when the clinic is closed that cannot wait until the next business day, you can page your doctor at the number below.    Please note that while we do our best to be available for urgent issues outside of office hours, we are not available 24/7.   If you have an urgent issue and are unable to reach [place], you may choose to seek medical care at your doctor's office, retail clinic, urgent care center, or emergency room.  If you have a medical emergency, please immediately call 911 or go to the emergency department. In the event of inclement weather, please call our main line at [phone] for an update on the status of any delays or closures.  Dermatology Medication Tips: Please keep the boxes that topical medications come in in order to help keep track of the instructions about where and how to use these. Pharmacies typically print the medication instructions only on the boxes and not directly on the medication tubes.   If your medication is too expensive, please contact our office at [phone] or send [place] a message through MyChart.   We are unable to tell what your co-pay  for medications will be in advance as this is different depending on your insurance coverage. However, we may be able to find a substitute medication at lower cost or fill out paperwork to get insurance to cover a needed medication.   If a prior authorization is required to get your medication covered by your insurance company, please allow [place] 1-2 business days to complete this process.  Drug prices often vary depending on where the prescription is filled and some pharmacies may offer  cheaper prices.  The website www.goodrx.com contains coupons for medications through different pharmacies. The prices here do not account for what the cost may be with help from insurance (it may be cheaper with your insurance), but the website can give you the price if you did not use any insurance.  - You can print the associated coupon and take it with your prescription to the pharmacy.  - You may also stop by our office during regular business hours and pick up a GoodRx coupon card.  - If you need your prescription sent electronically to a different pharmacy, notify our office through [facility] or by phone at [phone]

## 2023-02-21 IMAGING — DX DG SHOULDER 2+V*R*
3 series · 3 of 3 positions shown · non-contrast
Comparison: None available

CLINICAL DATA: Bilateral shoulder pain. Diagnosed with torn right
rotator cuff about 1 year ago.

EXAM:
RIGHT SHOULDER - 2+ VIEW

[shoulder grashey]
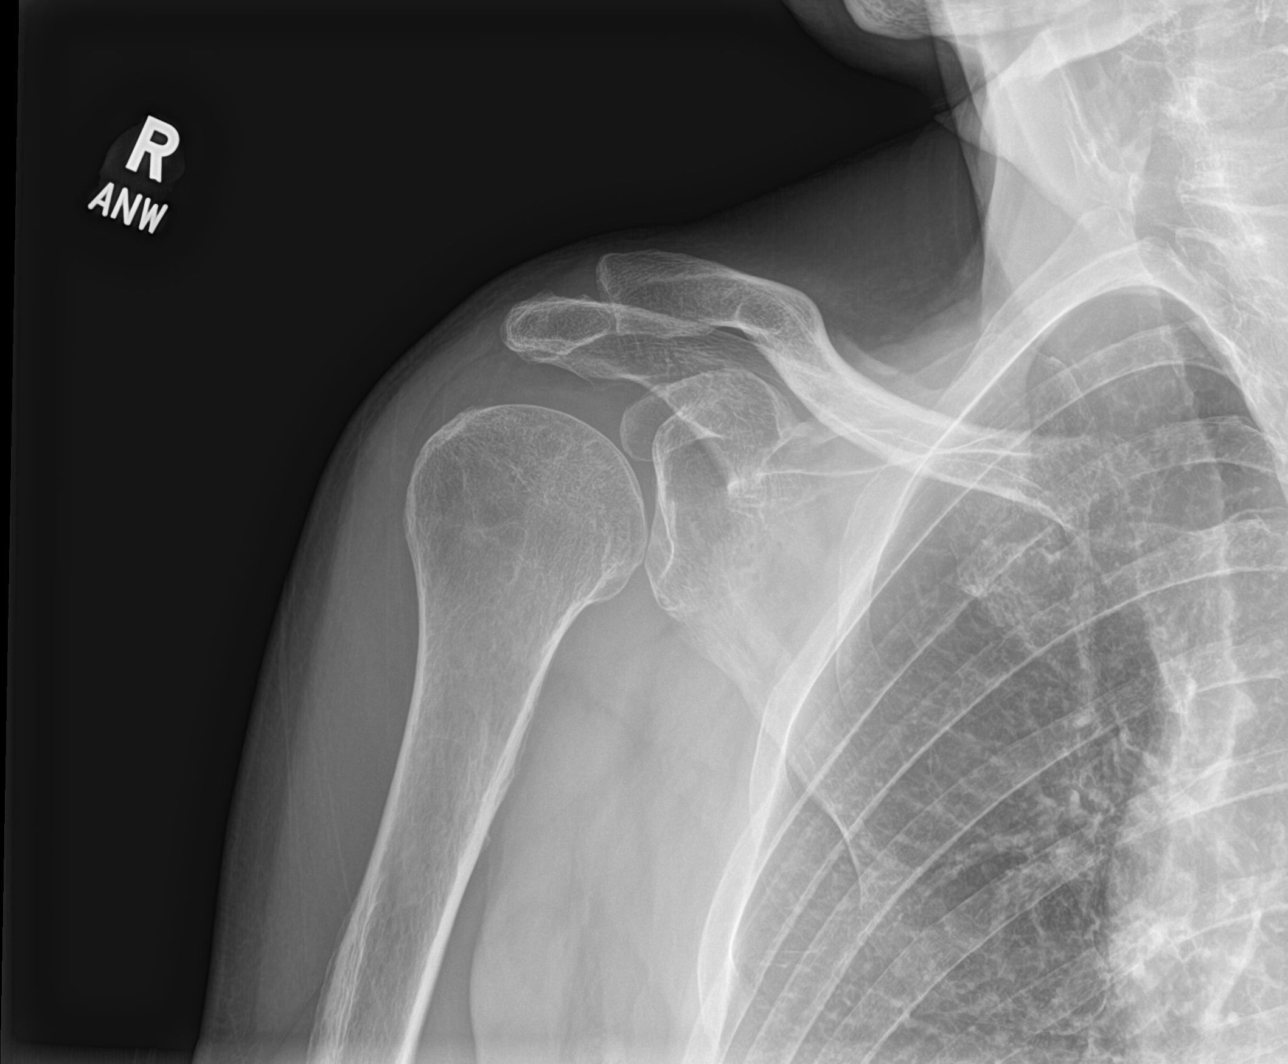

[shoulder y view]
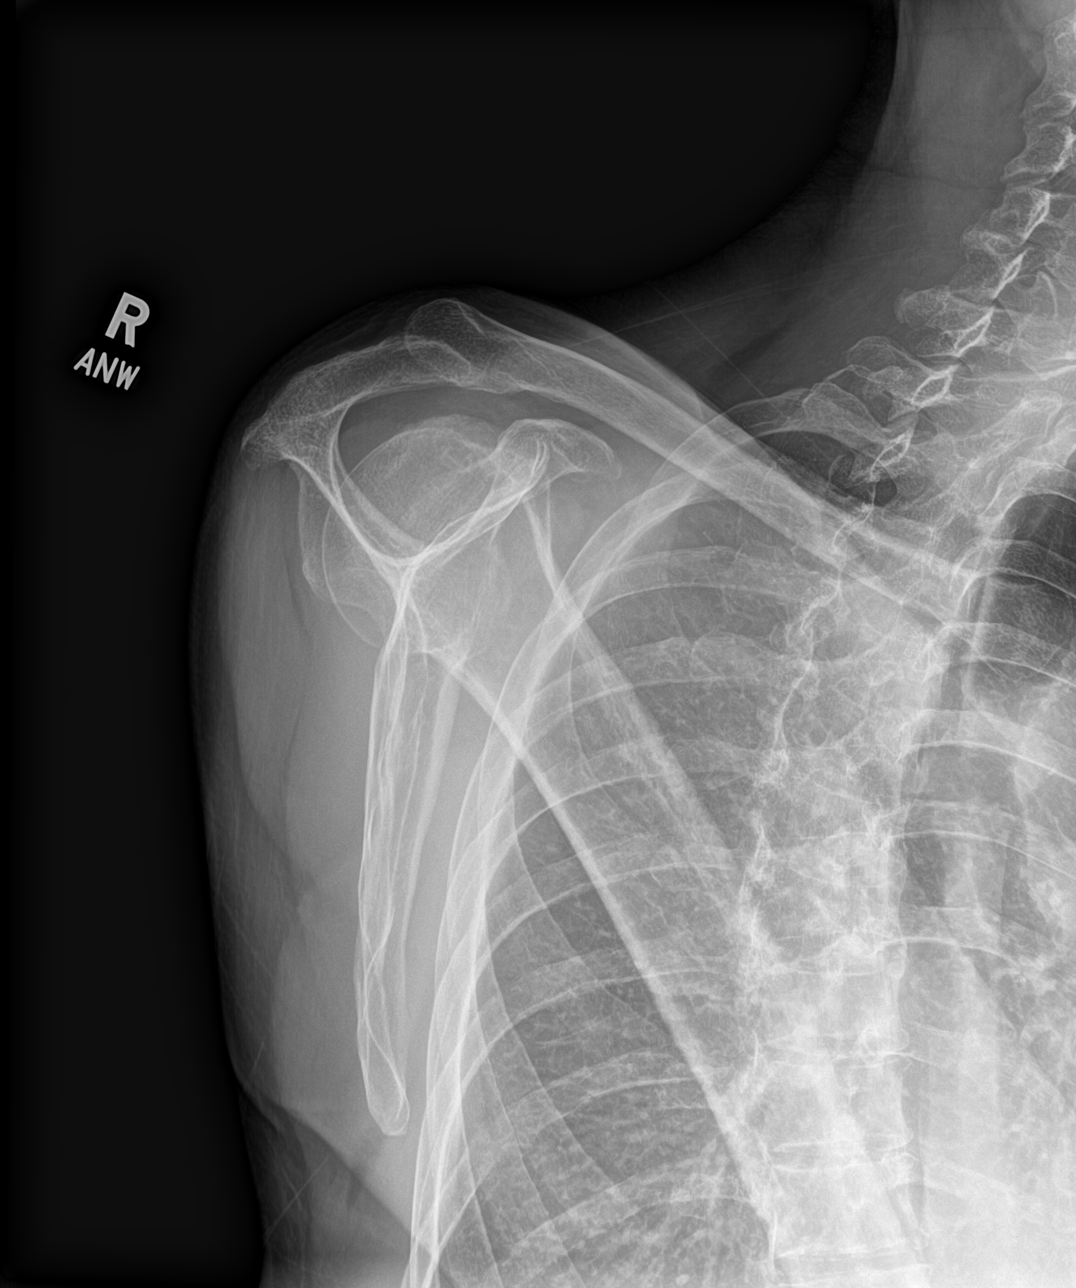

[shoulder axillary]
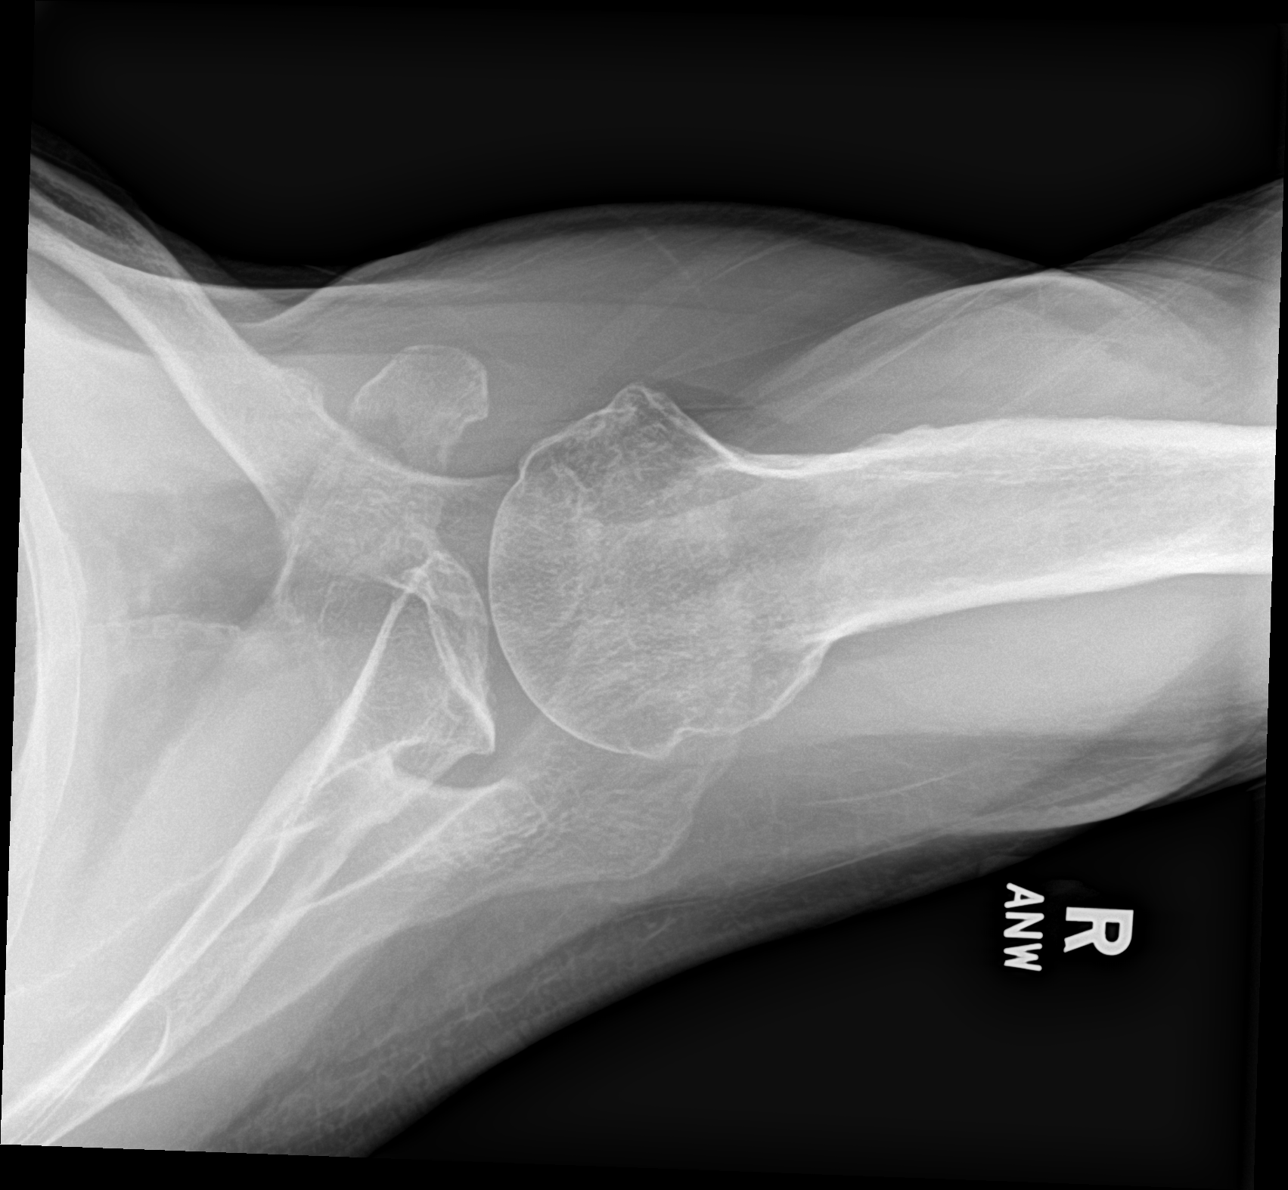

[3 of 3 positions shown; findings below may reference images not displayed]

FINDINGS: Mild glenoid fossa degenerative undulation/cortical irregularity.
Mild posterior glenoid degenerative osteophytosis. Moderate inferior
acromioclavicular joint space narrowing with mild peripheral
degenerative spurring. No acute fracture is seen. No dislocation.
The visualized portion of the right lung is unremarkable.
IMPRESSION: Mild glenohumeral and acromioclavicular osteoarthritis.

## 2023-03-28 ENCOUNTER — Encounter: Payer: Self-pay | Admitting: Endocrinology

## 2023-03-28 ENCOUNTER — Ambulatory Visit: Payer: 59 | Admitting: Endocrinology

## 2023-03-28 VITALS — BP 116/60 | HR 97 | Resp 20 | Ht 66.0 in | Wt 127.4 lb

## 2023-03-28 DIAGNOSIS — Z794 Long term (current) use of insulin: Secondary | ICD-10-CM

## 2023-03-28 DIAGNOSIS — E1169 Type 2 diabetes mellitus with other specified complication: Secondary | ICD-10-CM | POA: Diagnosis not present

## 2023-03-28 DIAGNOSIS — E1121 Type 2 diabetes mellitus with diabetic nephropathy: Secondary | ICD-10-CM

## 2023-03-28 MED ORDER — LANTUS SOLOSTAR 100 UNIT/ML ~~LOC~~ SOPN: 30.0000 [IU] | PEN_INJECTOR | Freq: Every day | SUBCUTANEOUS | 3 refills | Status: DC

## 2023-03-28 MED ORDER — EMPAGLIFLOZIN 25 MG PO TABS: 25.0000 mg | ORAL_TABLET | Freq: Every day | ORAL | 3 refills | Status: DC

## 2023-03-28 MED ORDER — METFORMIN HCL ER 500 MG PO TB24: 2000.0000 mg | ORAL_TABLET | Freq: Every morning | ORAL | 3 refills | Status: DC

## 2023-03-28 MED ORDER — PIOGLITAZONE HCL 30 MG PO TABS: 30.0000 mg | ORAL_TABLET | Freq: Every day | ORAL | 3 refills | Status: DC

## 2023-03-28 MED ORDER — DEXCOM G7 SENSOR MISC
1.0000 | 0 refills | Status: DC
Start: 2023-03-28 — End: 2023-05-09

## 2023-03-28 MED ORDER — OZEMPIC (2 MG/DOSE) 8 MG/3ML ~~LOC~~ SOPN: PEN_INJECTOR | SUBCUTANEOUS | 3 refills | Status: DC

## 2023-03-28 NOTE — Patient Instructions (Addendum)
Diabetes regimen Lantus take 30 to 45 minutes.  Take 30 units when eating less or nonworking days when at home.  Okay to take  up to 45 units on working days or when eating more.  Continue Jardiance 25 mg daily.  Continue Ozempic 2 mg weekly.  Continue metformin extended release 2000 mg daily.  Start pioglitazone/Actos 30 mg daily.  I would like to check blood test for type 1 diabetes.  Will try continuous glucose monitoring sent prescription to her pharmacy.

## 2023-03-28 NOTE — Progress Notes (Signed)
Outpatient Endocrinology Note [place] Zofia Peckinpaugh, MD   Patient's Name: Christopher Cooper    DOB: 1960-05-03    MRN: [id number]                                                    REASON OF VISIT: New consult for type 2 diabetes mellitus  REFERRING PROVIDER: [place], Buren Kos, MD  PCP: [place], Buren Kos, MD  HISTORY OF PRESENT ILLNESS:   ETTORE TREBILCOCK is a 63 y.o. old male with past medical history listed below, is here for new consult for type 2 diabetes mellitus.   Pertinent Diabetes History: Patient was diagnosed with type 2 diabetes mellitus in 2015.  Patient has uncontrolled type 2 diabetes mellitus with hemoglobin A1c mostly in the range of 9% and referred to endocrinology for further evaluation and management.  History of DKA or diabetes related hospitalizations: none  Previous diabetes education: Yes   Family h/o diabetes mellitus: father type 2, cousin with type 1 diabetes.    No personal history of pancreatitis and / or family history of medullary thyroid carcinoma or MEN 2B syndrome.   Chronic Diabetes Complications : Retinopathy: no. Last ophthalmology exam was done on annually, following with ophthalmology regularly.  Nephropathy: CKD Peripheral neuropathy: no Coronary artery disease: no Stroke: no  Relevant comorbidities and cardiovascular risk factors: Obesity: no Body mass index is 20.56 kg/m.  Hypertension: Yes  Hyperlipidemia : Yes, on statin   Current / Home Diabetic regimen includes:  Lantus 44 units daily in the morning.  Metformin XR 2000 mg daily.  Jardiance 25 mg daily.  Ozempic 2 mg weekly,   Prior diabetic medications: ?  Actos in the past.  Glycemic data:   Glucose log from his tablet reviewed in last 2 weeks.  He has been mostly taking in the morning fasting and occasionally as needed in the afternoon and at bedtime.  Fasting blood sugar 183, 162, 198, 111, 138, 266, 96, 196, 112, 87, 121, 94, 149, 146  In the afternoon 212  At bedtime 48,  53   Hypoglycemia: Patient has  hypoglycemic episodes. Patient has hypoglycemia awareness.  Factors modifying glucose control: 1.  Diabetic diet assessment: 3 meals a day, vegetables. Burger, fries. Soft drinks. Sweet tea.  He reports he is not good at eating proper diet especially when working eats fast food and outside food on working days.  His diet is better at home.  2.  Staying active or exercising: active at work.  3.  Medication compliance: compliant most of the time.  Sometimes he forgets to take his medication in the morning, about 1 day in a week.  Interval history  Patient admits that his diet is not good for diabetes especially on working days.  He works 5 days a week.  His diet is better and eats less when at home.  However he sometime misses to take medication at home, about once a week.  Glucose log reviewed.  He has variable blood sugar with occasional hypoglycemia in the evening he reports those are during his nonworking days when eating less.  He has a variable blood sugar fasting in the morning overall acceptable however he has occasional hyperglycemia in the morning and afternoon , especially on working days due to eating more.  Currently he is thin with BMI  20.  He reports he will lost some weight over the past few years.  Hemoglobin A1c was 9.2% in December 2024.  REVIEW OF SYSTEMS As per history of present illness.   PAST MEDICAL HISTORY: Past Medical History:  Diagnosis Date   Asbestos exposure    CAD in native artery    Diabetes (HCC)    GERD (gastroesophageal reflux disease)    Hyperlipidemia LDL goal <70    Hypertension    S/P angioplasty with stent, 11/25/15 to mRCA to dRCA promus premier DES 11/26/2015    PAST SURGICAL HISTORY: Past Surgical History:  Procedure Laterality Date   ANKLE ARTHROSCOPY Left    after injury   CARDIAC CATHETERIZATION N/A 11/25/2015   Procedure: Left Heart Cath and Coronary Angiography;  Surgeon: Peter M Swaziland, MD;   Location: [facility] INVASIVE CV LAB;  Service: Cardiovascular;  Laterality: N/A;   CARDIAC CATHETERIZATION N/A 11/25/2015   Procedure: Coronary Stent Intervention;  Surgeon: Peter M Swaziland, MD;  Location: [facility] INVASIVE CV LAB;  Service: Cardiovascular;  Laterality: N/A;   CORONARY ANGIOPLASTY WITH STENT PLACEMENT      ALLERGIES: No Known Allergies  FAMILY HISTORY:  Family History  Problem Relation Age of Onset   Diabetes Mellitus II Mother    Diabetes Mellitus II Father    CAD Other        Reports multiple distant relatives with history of MI    SOCIAL HISTORY: Social History   Socioeconomic History   Marital status: Married    Spouse name: Not on file   Number of children: Not on file   Years of education: Not on file   Highest education level: Not on file  Occupational History   Occupation: Social research officer, government at [employer]  Tobacco Use   Smoking status: Former    Current packs/day: 0.25    Types: Cigarettes    Passive exposure: Past   Smokeless tobacco: Never  Vaping Use   Vaping status: Never Used  Substance and Sexual Activity   Alcohol use: No   Drug use: No   Sexual activity: Yes    Birth control/protection: Other-see comments  Other Topics Concern   Not on file  Social History Narrative   He lives in [place] with his wife.   Social Drivers of Corporate investment banker Strain: Low Risk  (10/25/2022)   Overall Financial Resource Strain (CARDIA)    Difficulty of Paying Living Expenses: Not hard at all  Food Insecurity: No Food Insecurity (10/25/2022)   Hunger Vital Sign    Worried About Running Out of Food in the Last Year: Never true    Ran Out of Food in the Last Year: Never true  Transportation Needs: No Transportation Needs (10/25/2022)   PRAPARE - Administrator, Civil Service (Medical): No    Lack of Transportation (Non-Medical): No  Physical Activity: Insufficiently Active (10/25/2022)   Exercise Vital Sign    Days of Exercise per Week: 4 days     Minutes of Exercise per Session: 30 min  Stress: No Stress Concern Present (10/25/2022)   [employer] of Occupational Health - Occupational Stress Questionnaire    Feeling of Stress : Not at all  Social Connections: Moderately Integrated (10/25/2022)   Social Connection and Isolation Panel [NHANES]    Frequency of Communication with Friends and Family: Twice a week    Frequency of Social Gatherings with Friends and Family: Once a week    Attends Religious Services: 1 to  4 times per year    Active Member of Clubs or Organizations: No    Attends Banker Meetings: Never    Marital Status: Married    MEDICATIONS:  Current Outpatient Medications  Medication Sig Dispense Refill   aspirin EC 81 MG tablet Take 81 mg by mouth daily.     atorvastatin (LIPITOR) 80 MG tablet Take 1 tablet (80 mg total) by mouth daily. 90 tablet 1   B-D ULTRAFINE III SHORT PEN 31G X 8 MM MISC USE AS DIRECTED EVERY DAY 100 each 1   calcium carbonate (TUMS - DOSED IN MG ELEMENTAL CALCIUM) 500 MG chewable tablet Chew 1 tablet by mouth daily.     Continuous Glucose Sensor (DEXCOM G7 SENSOR) MISC 1 Device by Does not apply route continuous. 9 each 0   metoprolol succinate (TOPROL XL) 25 MG 24 hr tablet Take 1 tablet (25 mg total) by mouth daily. 90 tablet 3   nitroGLYCERIN (NITROSTAT) 0.4 MG SL tablet Place 1 tablet (0.4 mg total) under the tongue every 5 (five) minutes as needed for chest pain. 30 tablet 12   Omeprazole (PRILOSEC PO) Take by mouth.     ONETOUCH VERIO test strip Use as instructed 100 each 5   pioglitazone (ACTOS) 30 MG tablet Take 1 tablet (30 mg total) by mouth daily. 90 tablet 3   VASCEPA 1 g CAPS Take 2 capsules by mouth 2 (two) times daily.     empagliflozin (JARDIANCE) 25 MG TABS tablet Take 1 tablet (25 mg total) by mouth daily. 90 tablet 3   insulin glargine (LANTUS SOLOSTAR) 100 UNIT/ML Solostar Pen Inject 30-45 Units into the skin daily. 30 mL 3   metFORMIN (GLUCOPHAGE-XR)  500 MG 24 hr tablet Take 4 tablets (2,000 mg total) by mouth every morning. 360 tablet 3   Semaglutide, 2 MG/DOSE, (OZEMPIC, 2 MG/DOSE,) 8 MG/3ML SOPN Inject 2 mg subcutaneous once a week 9 mL 3   No current facility-administered medications for this visit.    PHYSICAL EXAM: Vitals:   03/28/23 0916  BP: 116/60  Pulse: 97  Resp: 20  SpO2: 97%  Weight: 127 lb 6.4 oz (57.8 kg)  Height: 5\' 6"  (1.676 m)   Body mass index is 20.56 kg/m.  Wt Readings from Last 3 Encounters:  03/28/23 127 lb 6.4 oz (57.8 kg)  01/24/23 129 lb 8 oz (58.7 kg)  10/25/22 128 lb 1.6 oz (58.1 kg)    General: Well developed, well nourished male in no apparent distress.  HEENT: AT/[place], no external lesions.  Eyes: Conjunctiva clear and no icterus. Neck: Neck supple  Lungs: Respirations not labored Neurologic: Alert, oriented, normal speech Extremities / Skin: Dry.   Psychiatric: Does not appear depressed or anxious  Diabetic Foot Exam - Simple   No data filed    LABS Reviewed Lab Results  Component Value Date   HGBA1C 9.2 (H) 01/17/2023   HGBA1C 9.3 (H) 11/29/2022   HGBA1C 9.2 (H) 10/25/2022   No results found for: "FRUCTOSAMINE" Lab Results  Component Value Date   CHOL 65 (L) 11/29/2022   HDL 38 (L) 11/29/2022   LDLCALC 3 11/29/2022   TRIG 140 11/29/2022   CHOLHDL 1.7 11/29/2022   Lab Results  Component Value Date   MICRALBCREAT 9 10/25/2022   MICRALBCREAT <12 09/27/2021   Lab Results  Component Value Date   CREATININE 1.34 (H) 11/29/2022   No results found for: "GFR"  ASSESSMENT / PLAN  1. Type 2 diabetes mellitus with other  specified complication, with long-term current use of insulin (HCC)   2. Type 2 diabetes mellitus with diabetic nephropathy, with long-term current use of insulin (HCC)     Diabetes Mellitus type 2, complicated by CKD. - Diabetic status / severity: Uncontrolled.  Lab Results  Component Value Date   HGBA1C 9.2 (H) 01/17/2023    - Hemoglobin A1c goal :  <6.5%  Discussed about diabetes mellitus type 2 versus type I.  Discussed about potential chronic diabetic complications including diabetic retinopathy, neuropathy and nephropathy.  Patient has remained uncontrolled diabetes despite multiple antidiabetic medications.  He admits that his diet is not good for the diabetes probably making him uncontrolled.  He is thin built.  His blood sugar are variable with hypo and hyperglycemia.  I would like to check type I autoimmune diabetes lab test.  He is relatively on high dose of basal insulin. It is probably contributing for hypoglycemia in the evening when not eating enough especially on nonworking days at home and eating better.  Discussed about mealtime insulin, patient expressed difficulty of taking insulin at work.  Discussed that if his type 1 diabetes panel is positive we will need to plan for mealtime insulin.  - Medications: See below. Lantus take 30 to 45 minutes.  Take 30 units when eating less or nonworking days when at home.  Okay to take up to 45 units on working days or when eating more.  With the improvement on the diet stay on Lantus 30 units daily.  Continue Jardiance 25 mg daily.  Continue Ozempic 2 mg weekly.  Continue metformin extended release 2000 mg daily.  Start pioglitazone/Actos 30 mg daily.  I would like to check blood test for type 1 diabetes.  - Home glucose testing: Sent prescription for Dexcom G7 CGM.  Encouraged to use CGM.  We can adjust his diabetes regimen with more sugar data.  Check as needed. - Discussed/ Gave Hypoglycemia treatment plan.  # Consult : not required at this time.  Offered dietitian visit however he declined.  # Annual urine for microalbuminuria/ creatinine ratio, no microalbuminuria currently. Last  Lab Results  Component Value Date   MICRALBCREAT 9 10/25/2022    # Foot check nightly.  # Annual dilated diabetic eye exams.   - Diet: Make healthy diabetic food choices, discussed in  detail.  Asked to avoid sweet tea.  Limit carbohydrate and calorie intake.  No frequent snacking. - Life style / activity / exercise: Discussed.  2. Blood pressure  -  BP Readings from Last 1 Encounters:  03/28/23 116/60    - Control is in target.  - No change in current plans.  3. Lipid status / Hyperlipidemia - Last  Lab Results  Component Value Date   LDLCALC 3 11/29/2022   - Continue atorvastatin 80 mg daily.  Managed by primary care provider.  Diagnoses and all orders for this visit:  Type 2 diabetes mellitus with other specified complication, with long-term current use of insulin (HCC) -     pioglitazone (ACTOS) 30 MG tablet; Take 1 tablet (30 mg total) by mouth daily. -     C-peptide -     IA-2 Antibody -     ZNT8 Antibodies -     Glutamic acid decarboxylase auto abs -     Glucose, random -     Continuous Glucose Sensor (DEXCOM G7 SENSOR) MISC; 1 Device by Does not apply route continuous.  Type 2 diabetes mellitus with diabetic nephropathy, with long-term current  use of insulin (HCC) -     insulin glargine (LANTUS SOLOSTAR) 100 UNIT/ML Solostar Pen; Inject 30-45 Units into the skin daily. -     empagliflozin (JARDIANCE) 25 MG TABS tablet; Take 1 tablet (25 mg total) by mouth daily. -     metFORMIN (GLUCOPHAGE-XR) 500 MG 24 hr tablet; Take 4 tablets (2,000 mg total) by mouth every morning. -     Semaglutide, 2 MG/DOSE, (OZEMPIC, 2 MG/DOSE,) 8 MG/3ML SOPN; Inject 2 mg subcutaneous once a week    DISPOSITION Follow up in clinic in 6 weeks suggested.   All questions answered and patient verbalized understanding of the plan.  [place] Donnabelle Blanchard, MD [facility] Endocrinology [facility] [address] [place], [address] [place], [place] [address] Phone # [phone]  At least part of this note was generated using voice recognition software. Inadvertent word errors may have occurred, which were not recognized during the proofreading process.

## 2023-04-04 ENCOUNTER — Encounter: Payer: Self-pay | Admitting: Endocrinology

## 2023-04-05 LAB — GLUTAMIC ACID DECARBOXYLASE AUTO ABS: Glutamic Acid Decarb Ab: >250 [IU]/mL — ABNORMAL HIGH (ref <5)

## 2023-04-05 LAB — ZNT8 ANTIBODIES: ZNT8 Antibodies: 257 U/mL — ABNORMAL HIGH (ref <15)

## 2023-04-05 LAB — IA-2 ANTIBODY: IA-2 Antibody: 47.5 U/mL — ABNORMAL HIGH (ref <5.4)

## 2023-04-05 LAB — C-PEPTIDE: C-Peptide: 1.51 ng/mL (ref 0.80–3.85)

## 2023-04-05 LAB — GLUCOSE, RANDOM: Glucose, Plasma: 118 mg/dL (ref 65–139)

## 2023-04-06 DIAGNOSIS — J069 Acute upper respiratory infection, unspecified: Secondary | ICD-10-CM | POA: Diagnosis not present

## 2023-04-14 ENCOUNTER — Other Ambulatory Visit (HOSPITAL_BASED_OUTPATIENT_CLINIC_OR_DEPARTMENT_OTHER): Payer: Self-pay | Admitting: Family Medicine

## 2023-04-25 ENCOUNTER — Ambulatory Visit (INDEPENDENT_AMBULATORY_CARE_PROVIDER_SITE_OTHER): Payer: 59 | Admitting: Family Medicine

## 2023-04-25 ENCOUNTER — Encounter (HOSPITAL_BASED_OUTPATIENT_CLINIC_OR_DEPARTMENT_OTHER): Payer: Self-pay | Admitting: Family Medicine

## 2023-04-25 VITALS — BP 105/71 | HR 82 | Ht 66.0 in | Wt 130.2 lb

## 2023-04-25 DIAGNOSIS — E1065 Type 1 diabetes mellitus with hyperglycemia: Secondary | ICD-10-CM

## 2023-04-25 DIAGNOSIS — Z1211 Encounter for screening for malignant neoplasm of colon: Secondary | ICD-10-CM

## 2023-04-25 MED ORDER — ONETOUCH VERIO VI STRP
ORAL_STRIP | 5 refills | Status: AC
Start: 2023-04-25 — End: ?

## 2023-04-25 NOTE — Assessment & Plan Note (Signed)
 Patient was able to establish with endocrinologist, initial appointment about 1 month ago.  He did have additional testing with endocrinologist and results indicated that patient's underlying diabetes is actually type 1 diabetes.  He does have scheduled follow-up with endocrinologist later this month.  He was prescribed new medication, however reports that due to medications needing prior authorization, he is still waiting to receive approval for medication and to start these new medications. Foot exam completed in office today Hemoglobin A1c remains above goal, however hopefully with following up with endocrinologist, we will begin to see gradual improvement in blood sugar control.

## 2023-04-25 NOTE — Patient Instructions (Signed)

## 2023-04-25 NOTE — Progress Notes (Signed)
    Procedures performed today:    None.  Independent interpretation of notes and tests performed by another provider:   None.  Brief History, Exam, Impression, and Recommendations:    BP 105/71 (BP Location: Left Arm, Patient Position: Sitting, Cuff Size: Normal)   Pulse 82   Ht 5\' 6"  (1.676 m)   Wt 130 lb 3.2 oz (59.1 kg)   SpO2 100%   BMI 21.01 kg/m   Type 1 diabetes mellitus with hyperglycemia (HCC) Assessment & Plan: Patient was able to establish with endocrinologist, initial appointment about 1 month ago.  He did have additional testing with endocrinologist and results indicated that patient's underlying diabetes is actually type 1 diabetes.  He does have scheduled follow-up with endocrinologist later this month.  He was prescribed new medication, however reports that due to medications needing prior authorization, he is still waiting to receive approval for medication and to start these new medications. Foot exam completed in office today Hemoglobin A1c remains above goal, however hopefully with following up with endocrinologist, we will begin to see gradual improvement in blood sugar control.  Orders: -     OneTouch Verio; Use as instructed  Dispense: 100 each; Refill: 5  Special screening for malignant neoplasms, colon -     Cologuard  Return in about 4 months (around 08/25/2023) for diabetes.   ___________________________________________ Jeremiyah Cullens de [place], MD, ABFM, CAQSM Primary Care and Sports Medicine [facility]

## 2023-05-09 ENCOUNTER — Other Ambulatory Visit: Payer: Self-pay | Admitting: Endocrinology

## 2023-05-09 ENCOUNTER — Encounter: Payer: Self-pay | Admitting: Endocrinology

## 2023-05-09 ENCOUNTER — Ambulatory Visit (INDEPENDENT_AMBULATORY_CARE_PROVIDER_SITE_OTHER): Payer: 59 | Admitting: Endocrinology

## 2023-05-09 VITALS — BP 118/64 | HR 80 | Ht 66.0 in | Wt 129.6 lb

## 2023-05-09 DIAGNOSIS — E1065 Type 1 diabetes mellitus with hyperglycemia: Secondary | ICD-10-CM

## 2023-05-09 LAB — POCT GLYCOSYLATED HEMOGLOBIN (HGB A1C): Hemoglobin A1C: 8.6 % — AB (ref 4.0–5.6)

## 2023-05-09 MED ORDER — TRESIBA FLEXTOUCH 100 UNIT/ML ~~LOC~~ SOPN: PEN_INJECTOR | SUBCUTANEOUS | 4 refills | Status: DC

## 2023-05-09 MED ORDER — NOVOLOG FLEXPEN 100 UNIT/ML ~~LOC~~ SOPN
5.0000 [IU] | PEN_INJECTOR | Freq: Three times a day (TID) | SUBCUTANEOUS | 11 refills | Status: DC
Start: 2023-05-09 — End: 2023-09-12

## 2023-05-09 MED ORDER — DEXCOM G7 SENSOR MISC: 1.0000 | 3 refills | Status: DC

## 2023-05-09 MED ORDER — INSULIN GLARGINE-YFGN 100 UNIT/ML ~~LOC~~ SOPN: PEN_INJECTOR | SUBCUTANEOUS | 4 refills | Status: DC

## 2023-05-09 NOTE — Progress Notes (Signed)
 Outpatient Endocrinology Note Christopher Yanissa Michalsky, MD   Patient's Name: Christopher Cooper    DOB: 23-Jul-1960    MRN: [id number]                                                    REASON OF VISIT: Follow up for type 1 diabetes mellitus  REFERRING PROVIDER: de [place], Buren Kos, MD  PCP: de [place], Buren Kos, MD  HISTORY OF PRESENT ILLNESS:   Christopher Cooper is a 63 y.o. old male with past medical history listed below, is here for follow-up for type 1 diabetes mellitus.   Pertinent Diabetes History: Patient was diagnosed with diabetes mellitus in 2015.  Patient has uncontrolled diabetes mellitus with hemoglobin A1c mostly in the range of 9% and was referred to endocrinology for further evaluation and management, initial consult in February 2025.  Patient was being managed as type 2 diabetes mellitus until February 2025.  Type I autoimmune diabetes panel was checked in March 28, 2023, showed elevated IA 2 antibodies 47, zinc transporter 8 antibody 47, GAD 65 antibody more than 250 consistent with type 1 diabetes mellitus.  History of DKA or diabetes related hospitalizations: none  Previous diabetes education: Yes   Family h/o diabetes mellitus: father type 2, cousin with type 1 diabetes.    No personal history of pancreatitis and / or family history of medullary thyroid carcinoma or MEN 2B syndrome.    Latest Reference Range & Units 03/28/23 10:23  IA-2 Antibody <5.4 U/mL 47.5 (H)  Glucose, Plasma 65 - 139 mg/dL 191  ZNT8 Antibodies <47 U/mL 257 (H)  Glutamic Acid Decarb Ab <5 IU/mL >250 (H)  C-Peptide 0.80 - 3.85 ng/mL 1.51  (H): Data is abnormally high  Chronic Diabetes Complications : Retinopathy: no. Last ophthalmology exam was done on annually, following with ophthalmology regularly.  Nephropathy: CKD Peripheral neuropathy: no Coronary artery disease: no Stroke: no  Relevant comorbidities and cardiovascular risk factors: Obesity: no Body mass index is 20.92 kg/m.  Hypertension:  Yes  Hyperlipidemia : Yes, on statin   Current / Home Diabetic regimen includes:  Lantus 35 units when off days and 44 units on work day.  Metformin XR 2000 mg daily.  Jardiance 25 mg daily.  Ozempic 2 mg weekly./ Actos 30 mg daily.  Prior diabetic medications: ?  Actos in the past.  Glycemic data:   Glucometer data download from March 14 to May 09, 2023 reviewed, average blood sugar 130.  Fasting blood sugar 123, 171, 70, 162, 67, 80, 77, 224, afternoon blood sugar 72, 372, 124, 84, bedtime blood sugar 91, 81, 40, 55, 364. Variable blood sugar with occasional hypoglycemia in the early morning and in the evening.  Hypoglycemia: Patient has  hypoglycemic episodes. Patient has hypoglycemia awareness.  Factors modifying glucose control: 1.  Diabetic diet assessment: 3 meals a day, vegetables. Burger, fries. Soft drinks. Sweet tea.  He reports he is not good at eating proper diet especially when working eats fast food and outside food on working days.  His diet is better at home.  2.  Staying active or exercising: active at work.  3.  Medication compliance: compliant most of the time.  Sometimes he forgets to take his medication in the morning, about 1 day in a week.  Interval history  Patient has significantly elevated type  I autoimmune antibodies, consistent with type 1 diabetes mellitus.  He has hemoglobin A1c 8.6% today slightly improved.  Blood sugar data as reviewed above variable blood sugar with occasional hypoglycemia.  Diabetes regimen as reviewed and noted above.  No other complaint today.  REVIEW OF SYSTEMS As per history of present illness.   PAST MEDICAL HISTORY: Past Medical History:  Diagnosis Date   Asbestos exposure    CAD in native artery    Diabetes (HCC)    GERD (gastroesophageal reflux disease)    Hyperlipidemia LDL goal <70    Hypertension    S/P angioplasty with stent, 11/25/15 to mRCA to dRCA promus premier DES 11/26/2015    PAST SURGICAL  HISTORY: Past Surgical History:  Procedure Laterality Date   ANKLE ARTHROSCOPY Left    after injury   CARDIAC CATHETERIZATION N/A 11/25/2015   Procedure: Left Heart Cath and Coronary Angiography;  Surgeon: Peter M Swaziland, MD;  Location: [facility] INVASIVE CV LAB;  Service: Cardiovascular;  Laterality: N/A;   CARDIAC CATHETERIZATION N/A 11/25/2015   Procedure: Coronary Stent Intervention;  Surgeon: Peter M Swaziland, MD;  Location: [facility] INVASIVE CV LAB;  Service: Cardiovascular;  Laterality: N/A;   CORONARY ANGIOPLASTY WITH STENT PLACEMENT      ALLERGIES: No Known Allergies  FAMILY HISTORY:  Family History  Problem Relation Age of Onset   Diabetes Mellitus II Mother    Diabetes Mellitus II Father    CAD Other        Reports multiple distant relatives with history of MI    SOCIAL HISTORY: Social History   Socioeconomic History   Marital status: Married    Spouse name: Not on file   Number of children: Not on file   Years of education: Not on file   Highest education level: Not on file  Occupational History   Occupation: Social research officer, government at [employer]  Tobacco Use   Smoking status: Former    Current packs/day: 0.25    Types: Cigarettes    Passive exposure: Past   Smokeless tobacco: Never  Vaping Use   Vaping status: Never Used  Substance and Sexual Activity   Alcohol use: No   Drug use: No   Sexual activity: Yes    Birth control/protection: Other-see comments  Other Topics Concern   Not on file  Social History Narrative   He lives in [place] with his wife.   Social Drivers of Corporate investment banker Strain: Low Risk  (10/25/2022)   Overall Financial Resource Strain (CARDIA)    Difficulty of Paying Living Expenses: Not hard at all  Food Insecurity: No Food Insecurity (10/25/2022)   Hunger Vital Sign    Worried About Running Out of Food in the Last Year: Never true    Ran Out of Food in the Last Year: Never true  Transportation Needs: No Transportation Needs  (10/25/2022)   PRAPARE - Administrator, Civil Service (Medical): No    Lack of Transportation (Non-Medical): No  Physical Activity: Insufficiently Active (10/25/2022)   Exercise Vital Sign    Days of Exercise per Week: 4 days    Minutes of Exercise per Session: 30 min  Stress: No Stress Concern Present (10/25/2022)   [employer] of Occupational Health - Occupational Stress Questionnaire    Feeling of Stress : Not at all  Social Connections: Moderately Integrated (10/25/2022)   Social Connection and Isolation Panel [NHANES]    Frequency of Communication with Friends and Family: Twice a week  Frequency of Social Gatherings with Friends and Family: Once a week    Attends Religious Services: 1 to 4 times per year    Active Member of [employer] or Organizations: No    Attends Banker Meetings: Never    Marital Status: Married    MEDICATIONS:  Current Outpatient Medications  Medication Sig Dispense Refill   aspirin EC 81 MG tablet Take 81 mg by mouth daily.     atorvastatin (LIPITOR) 80 MG tablet TAKE 1 TABLET BY MOUTH EVERY DAY 90 tablet 1   B-D ULTRAFINE III SHORT PEN 31G X 8 MM MISC USE AS DIRECTED EVERY DAY 100 each 1   calcium carbonate (TUMS - DOSED IN MG ELEMENTAL CALCIUM) 500 MG chewable tablet Chew 1 tablet by mouth daily.     empagliflozin (JARDIANCE) 25 MG TABS tablet Take 1 tablet (25 mg total) by mouth daily. 90 tablet 3   glucose blood (ONETOUCH VERIO) test strip Use as instructed 100 each 5   insulin aspart (NOVOLOG FLEXPEN) 100 UNIT/ML FlexPen Inject 5 Units into the skin 3 (three) times daily with meals. 15 mL 11   metFORMIN (GLUCOPHAGE-XR) 500 MG 24 hr tablet Take 4 tablets (2,000 mg total) by mouth every morning. 360 tablet 3   metoprolol succinate (TOPROL XL) 25 MG 24 hr tablet Take 1 tablet (25 mg total) by mouth daily. 90 tablet 3   nitroGLYCERIN (NITROSTAT) 0.4 MG SL tablet Place 1 tablet (0.4 mg total) under the tongue every 5 (five) minutes  as needed for chest pain. 30 tablet 12   pioglitazone (ACTOS) 30 MG tablet Take 1 tablet (30 mg total) by mouth daily. 90 tablet 3   Continuous Glucose Sensor (DEXCOM G7 SENSOR) MISC 1 Device by Does not apply route continuous. 9 each 3   insulin glargine-yfgn (SEMGLEE, YFGN,) 100 UNIT/ML Pen Take 30 units on workdays and 20 units on nonworking days daily. 30 mL 4   Omeprazole (PRILOSEC PO) Take by mouth. (Patient not taking: Reported on 05/09/2023)     Semaglutide, 2 MG/DOSE, (OZEMPIC, 2 MG/DOSE,) 8 MG/3ML SOPN Inject 2 mg subcutaneous once a week (Patient not taking: Reported on 05/09/2023) 9 mL 3   VASCEPA 1 g CAPS Take 2 capsules by mouth 2 (two) times daily. (Patient not taking: Reported on 05/09/2023)     No current facility-administered medications for this visit.    PHYSICAL EXAM: Vitals:   05/09/23 1332  BP: 118/64  Pulse: 80  SpO2: 98%  Weight: 129 lb 9.6 oz (58.8 kg)  Height: 5\' 6"  (1.676 m)   Body mass index is 20.92 kg/m.  Wt Readings from Last 3 Encounters:  05/09/23 129 lb 9.6 oz (58.8 kg)  04/25/23 130 lb 3.2 oz (59.1 kg)  03/28/23 127 lb 6.4 oz (57.8 kg)    General: Well developed, well nourished male in no apparent distress.  HEENT: AT/[place], no external lesions.  Eyes: Conjunctiva clear and no icterus. Neck: Neck supple  Lungs: Respirations not labored Neurologic: Alert, oriented, normal speech Extremities / Skin: Dry.   Psychiatric: Does not appear depressed or anxious  Diabetic Foot Exam - Simple   No data filed    LABS Reviewed Lab Results  Component Value Date   HGBA1C 8.6 (A) 05/09/2023   HGBA1C 9.2 (H) 01/17/2023   HGBA1C 9.3 (H) 11/29/2022   No results found for: "FRUCTOSAMINE" Lab Results  Component Value Date   CHOL 65 (L) 11/29/2022   HDL 38 (L) 11/29/2022   LDLCALC 3  11/29/2022   TRIG 140 11/29/2022   CHOLHDL 1.7 11/29/2022   Lab Results  Component Value Date   MICRALBCREAT 9 10/25/2022   MICRALBCREAT <12 09/27/2021   Lab Results   Component Value Date   CREATININE 1.34 (H) 11/29/2022   No results found for: "GFR"  ASSESSMENT / PLAN  1. Uncontrolled type 1 diabetes mellitus with hyperglycemia (HCC)   2. Type 1 diabetes mellitus with hyperglycemia (HCC)     Diabetes Mellitus type 2, complicated by CKD. - Diabetic status / severity: Uncontrolled.  Lab Results  Component Value Date   HGBA1C 8.6 (A) 05/09/2023    - Hemoglobin A1c goal : <6.5%  Patient was diagnosed with diabetes mellitus in 2015, was being managed as type 2 diabetes mellitus until February 2025.  In February 2025 patient has positive antibodies consistent with type 1 diabetes mellitus.  Discussed difference between type I and type 2 diabetes mellitus.  Discussed that type 1 diabetes mellitus as she is insulin-dependent and he should never miss to take insulin.  She does not have optimal control of diabetes mellitus.  He needs mealtime insulin for the optimal control of diabetes.  He agreed to take fast acting mealtime insulin.   - Medications: See below.  Insulin and start mealtime insulin as follows.   Changed long-acting insulin from Lantus to [facility] based on insurance formulary.  Take Tresiba 30 units on workdays and 20 units on nonworking days daily once a day.  Start NovoLog 5 units with meals take 10 to 15 minutes before eating up to 3 times a day.  Okay to continue Jardiance, Ozempic, metformin and pioglitazone for now.  At some point these medicines need to be stopped as these are type II medication.  It is okay to continue for now.  Continue Jardiance 25 mg daily.  Continue Ozempic 2 mg weekly.  Continue metformin extended release 2000 mg daily.  Continue pioglitazone/Actos 30 mg daily.  - Home glucose testing: Sent prescription for Dexcom G7 CGM.  Encouraged to use CGM.  We can adjust his diabetes regimen with more sugar data.  Check as needed.  Resend Dexcom G7.  - Discussed/ Gave Hypoglycemia treatment plan.  #  Consult : not required at this time.    # Annual urine for microalbuminuria/ creatinine ratio, no microalbuminuria currently. Last  Lab Results  Component Value Date   MICRALBCREAT 9 10/25/2022    # Foot check nightly.  # Annual dilated diabetic eye exams.   - Diet: Make healthy diabetic food choices, discussed in detail.  Asked to avoid sweet tea.  Limit carbohydrate and calorie intake.  No frequent snacking. - Life style / activity / exercise: Discussed.  2. Blood pressure  -  BP Readings from Last 1 Encounters:  05/09/23 118/64    - Control is in target.  - No change in current plans.  3. Lipid status / Hyperlipidemia - Last  Lab Results  Component Value Date   LDLCALC 3 11/29/2022   - Continue atorvastatin 80 mg daily.  Managed by primary care provider.  Diagnoses and all orders for this visit:  Uncontrolled type 1 diabetes mellitus with hyperglycemia (HCC) -     POCT glycosylated hemoglobin (Hb A1C)  Type 1 diabetes mellitus with hyperglycemia (HCC) -     Discontinue: insulin degludec (TRESIBA FLEXTOUCH) 100 UNIT/ML FlexTouch Pen; Take 30 units on workdays and 20 units on nonworking days 1 times daily. -     insulin aspart (NOVOLOG FLEXPEN) 100 UNIT/ML  FlexPen; Inject 5 Units into the skin 3 (three) times daily with meals. -     Continuous Glucose Sensor (DEXCOM G7 SENSOR) MISC; 1 Device by Does not apply route continuous. -     insulin glargine-yfgn (SEMGLEE, YFGN,) 100 UNIT/ML Pen; Take 30 units on workdays and 20 units on nonworking days daily.    DISPOSITION Follow up in clinic in 3 months suggested.   All questions answered and patient verbalized understanding of the plan.  Christopher Holy Battenfield, MD [facility] [address] [place], [address] [place], [place] [address] Phone # [phone]  At least part of this note was generated using voice recognition software. Inadvertent word errors may have occurred, which were not recognized  during the proofreading process.

## 2023-05-09 NOTE — Patient Instructions (Signed)
 Diabetes regimen  Changed long-acting insulin from Lantus to [place].  Take Tresiba 30 units on workdays and 20 units on nonworking days daily once a day.  Start NovoLog 5 units with meals take 10 to 15 minutes before eating up to 3 times a day.  Okay to continue Jardiance, Ozempic, metformin and pioglitazone for now.  At some point these medicines need to be stopped as these are type II medication.  It is okay to continue for now.

## 2023-05-12 LAB — COLOGUARD: COLOGUARD: POSITIVE — AB

## 2023-05-15 ENCOUNTER — Other Ambulatory Visit (HOSPITAL_BASED_OUTPATIENT_CLINIC_OR_DEPARTMENT_OTHER): Payer: Self-pay | Admitting: Family Medicine

## 2023-05-15 DIAGNOSIS — R195 Other fecal abnormalities: Secondary | ICD-10-CM

## 2023-07-13 ENCOUNTER — Other Ambulatory Visit (HOSPITAL_BASED_OUTPATIENT_CLINIC_OR_DEPARTMENT_OTHER): Payer: Self-pay | Admitting: Family Medicine

## 2023-07-24 ENCOUNTER — Encounter: Payer: Self-pay | Admitting: Family Medicine

## 2023-08-05 ENCOUNTER — Other Ambulatory Visit: Payer: Self-pay | Admitting: Endocrinology

## 2023-08-05 DIAGNOSIS — E1065 Type 1 diabetes mellitus with hyperglycemia: Secondary | ICD-10-CM

## 2023-08-27 ENCOUNTER — Other Ambulatory Visit (HOSPITAL_BASED_OUTPATIENT_CLINIC_OR_DEPARTMENT_OTHER): Payer: Self-pay | Admitting: Family Medicine

## 2023-08-29 ENCOUNTER — Ambulatory Visit (HOSPITAL_BASED_OUTPATIENT_CLINIC_OR_DEPARTMENT_OTHER): Admitting: Family Medicine

## 2023-08-29 ENCOUNTER — Encounter (HOSPITAL_BASED_OUTPATIENT_CLINIC_OR_DEPARTMENT_OTHER): Payer: Self-pay | Admitting: Family Medicine

## 2023-08-29 VITALS — BP 112/64 | HR 75 | Ht 66.0 in | Wt 136.1 lb

## 2023-08-29 DIAGNOSIS — E1165 Type 2 diabetes mellitus with hyperglycemia: Secondary | ICD-10-CM | POA: Insufficient documentation

## 2023-08-29 DIAGNOSIS — E7800 Pure hypercholesterolemia, unspecified: Secondary | ICD-10-CM | POA: Insufficient documentation

## 2023-08-29 DIAGNOSIS — R52 Pain, unspecified: Secondary | ICD-10-CM | POA: Insufficient documentation

## 2023-08-29 DIAGNOSIS — D72829 Elevated white blood cell count, unspecified: Secondary | ICD-10-CM | POA: Insufficient documentation

## 2023-08-29 DIAGNOSIS — E1065 Type 1 diabetes mellitus with hyperglycemia: Secondary | ICD-10-CM

## 2023-08-29 DIAGNOSIS — J301 Allergic rhinitis due to pollen: Secondary | ICD-10-CM | POA: Insufficient documentation

## 2023-08-29 DIAGNOSIS — M12811 Other specific arthropathies, not elsewhere classified, right shoulder: Secondary | ICD-10-CM | POA: Insufficient documentation

## 2023-08-29 DIAGNOSIS — M25579 Pain in unspecified ankle and joints of unspecified foot: Secondary | ICD-10-CM | POA: Insufficient documentation

## 2023-08-29 DIAGNOSIS — Z794 Long term (current) use of insulin: Secondary | ICD-10-CM | POA: Insufficient documentation

## 2023-08-29 DIAGNOSIS — R195 Other fecal abnormalities: Secondary | ICD-10-CM | POA: Diagnosis not present

## 2023-08-29 DIAGNOSIS — I10 Essential (primary) hypertension: Secondary | ICD-10-CM | POA: Insufficient documentation

## 2023-08-29 DIAGNOSIS — Z0000 Encounter for general adult medical examination without abnormal findings: Secondary | ICD-10-CM

## 2023-08-29 LAB — POCT GLYCOSYLATED HEMOGLOBIN (HGB A1C)
HbA1c POC (<> result, manual entry): 6.8 % (ref 4.0–5.6)
HbA1c, POC (controlled diabetic range): 6.8 % (ref 0.0–7.0)
Hemoglobin A1C: 6.8 % — AB (ref 4.0–5.6)

## 2023-08-29 NOTE — Assessment & Plan Note (Signed)
 Patient had positive Cologuard test recently.  As result, he was referred to gastroenterologist to arrange for colonoscopy to further evaluate.  He has not arranged as of yet.  Did review with patient today and he does indicate that he has message sent from GI office with phone number to contact them.  Recommend that he reach out to them to schedule this appointment and discussed urgency related to this.

## 2023-08-29 NOTE — Patient Instructions (Signed)
  Medication Instructions:  Your physician recommends that you continue on your current medications as directed. Please refer to the Current Medication list given to you today. --If you need a refill on any your medications before your next appointment, please call your pharmacy first. If no refills are authorized on file call the office.-- Lab Work: Your physician has recommended that you have lab work today: 1 week before next visit  If you have labs (blood work) drawn today and your tests are completely normal, you will receive your results via MyChart message OR a phone call from our staff.  Please ensure you check your voicemail in the event that you authorized detailed messages to be left on a delegated number. If you have any lab test that is abnormal or we need to change your treatment, we will call you to review the results.  Follow-Up: Your next appointment:   Your physician recommends that you schedule a follow-up appointment in: 5-6 month follow up  with Dr. de Peru  You will receive a text message or e-mail with a link to a survey about your care and experience with us  today! We would greatly appreciate your feedback!   Thanks for letting us  be apart of your health journey!!  Primary Care and Sports Medicine   Dr. Court Distance Peru   We encourage you to activate your patient portal called MyChart.  Sign up information is provided on this After Visit Summary.  MyChart is used to connect with patients for Virtual Visits (Telemedicine).  Patients are able to view lab/test results, encounter notes, upcoming appointments, etc.  Non-urgent messages can be sent to your provider as well. To learn more about what you can do with MyChart, please visit --  [email or web address].

## 2023-08-29 NOTE — Assessment & Plan Note (Signed)
 Patient continues to follow with endocrinologist.  He did have laboratory testing with endocrinologist and this revealed that underlying diabetes is more consistent with type I as opposed to type 2 diabetes.  He is currently utilizing CGM device.  Has been taking Jardiance , pioglitazone  and metformin  as well as short and long-acting insulin . Point-of-care A1c today is improved and at goal of 6.8%.  Congratulated patient on his progress. Recommend continuing with current medication regimen, recommend continued follow-up with endocrinologist.

## 2023-08-29 NOTE — Assessment & Plan Note (Signed)
 Blood pressure appropriate in office today.  Only medication currently is metoprolol .  Patient denies any specific concerns Recommend continuing with intermittent monitoring, DASH diet.

## 2023-08-29 NOTE — Progress Notes (Signed)
    Procedures performed today:    None.  Independent interpretation of notes and tests performed by another provider:   None.  Brief History, Exam, Impression, and Recommendations:    BP 112/64 (BP Location: Right Arm, Patient Position: Sitting, Cuff Size: Normal)   Pulse 75   Ht 5' 6 (1.676 m)   Wt 136 lb 1.6 oz (61.7 kg)   SpO2 100%   BMI 21.97 kg/m   Type 1 diabetes mellitus with hyperglycemia (HCC) Assessment & Plan: Patient continues to follow with endocrinologist.  He did have laboratory testing with endocrinologist and this revealed that underlying diabetes is more consistent with type I as opposed to type 2 diabetes.  He is currently utilizing CGM device.  Has been taking Jardiance , pioglitazone  and metformin  as well as short and long-acting insulin . Point-of-care A1c today is improved and at goal of 6.8%.  Congratulated patient on his progress. Recommend continuing with current medication regimen, recommend continued follow-up with endocrinologist.  Orders: -     POCT glycosylated hemoglobin (Hb A1C)  Positive colorectal cancer screening using Cologuard test Assessment & Plan: Patient had positive Cologuard test recently.  As result, he was referred to gastroenterologist to arrange for colonoscopy to further evaluate.  He has not arranged as of yet.  Did review with patient today and he does indicate that he has message sent from GI office with phone number to contact them.  Recommend that he reach out to them to schedule this appointment and discussed urgency related to this.   Essential hypertension Assessment & Plan: Blood pressure appropriate in office today.  Only medication currently is metoprolol .  Patient denies any specific concerns Recommend continuing with intermittent monitoring, DASH diet.   Pure hypercholesterolemia Assessment & Plan: Patient continues with atorvastatin , denies any issues with medication at this time. We will plan to recheck  cholesterol panel with upcoming labs pertaining to physical   Wellness examination -     CBC with Differential/Platelet; Future -     Comprehensive metabolic panel with GFR; Future -     Hemoglobin A1c; Future -     Lipid panel; Future -     TSH Rfx on Abnormal to Free T4; Future  Return in about 5 months (around 01/29/2024) for CPE with fasting labs 1 week prior.   ___________________________________________ Katrina Daddona de Peru, MD, ABFM, CAQSM Primary Care and Sports Medicine [facility]

## 2023-08-29 NOTE — Assessment & Plan Note (Signed)
 Patient continues with atorvastatin , denies any issues with medication at this time. We will plan to recheck cholesterol panel with upcoming labs pertaining to physical

## 2023-08-30 ENCOUNTER — Other Ambulatory Visit: Payer: Self-pay | Admitting: Endocrinology

## 2023-08-30 DIAGNOSIS — E1065 Type 1 diabetes mellitus with hyperglycemia: Secondary | ICD-10-CM

## 2023-09-02 ENCOUNTER — Other Ambulatory Visit: Payer: Self-pay

## 2023-09-02 DIAGNOSIS — E1065 Type 1 diabetes mellitus with hyperglycemia: Secondary | ICD-10-CM

## 2023-09-02 MED ORDER — INSULIN GLARGINE 100 UNIT/ML SOLOSTAR PEN: PEN_INJECTOR | SUBCUTANEOUS | 1 refills | Status: DC

## 2023-09-12 ENCOUNTER — Encounter: Payer: Self-pay | Admitting: Endocrinology

## 2023-09-12 ENCOUNTER — Ambulatory Visit (INDEPENDENT_AMBULATORY_CARE_PROVIDER_SITE_OTHER): Admitting: Endocrinology

## 2023-09-12 VITALS — BP 122/70 | HR 86 | Resp 20 | Ht 66.0 in | Wt 135.4 lb

## 2023-09-12 DIAGNOSIS — E1065 Type 1 diabetes mellitus with hyperglycemia: Secondary | ICD-10-CM

## 2023-09-12 MED ORDER — INSULIN LISPRO (1 UNIT DIAL) 100 UNIT/ML (KWIKPEN): 5.0000 [IU] | PEN_INJECTOR | Freq: Three times a day (TID) | SUBCUTANEOUS | 11 refills | Status: AC

## 2023-09-12 NOTE — Progress Notes (Signed)
 Outpatient Endocrinology Note [place] Rashi Granier, MD   Patient's Name: Christopher Cooper    DOB: 11-18-1960    MRN: [id number]                                                    REASON OF VISIT: Follow up for type 1 diabetes mellitus  REFERRING PROVIDER: [place], Quintin PARAS, MD  PCP: [place], Quintin PARAS, MD  HISTORY OF PRESENT ILLNESS:   Christopher Cooper is a 63 y.o. old male with past medical history listed below, is here for follow-up for type 1 diabetes mellitus.   Pertinent Diabetes History: Patient was diagnosed with diabetes mellitus in 2015.  Patient has uncontrolled diabetes mellitus with hemoglobin A1c mostly in the range of 9% and was referred to endocrinology for further evaluation and management, initial consult in February 2025.  Patient was being managed as type 2 diabetes mellitus until February 2025.  Type I autoimmune diabetes panel was checked in March 28, 2023, showed elevated IA 2 antibodies 47, zinc transporter 8 antibody 47, GAD 65 antibody more than 250 consistent with type 1 diabetes mellitus.  History of DKA or diabetes related hospitalizations: none  Previous diabetes education: Yes   Family h/o diabetes mellitus: father type 2, cousin with type 1 diabetes.    No personal history of pancreatitis and / or family history of medullary thyroid carcinoma or MEN 2B syndrome.    Latest Reference Range & Units 03/28/23 10:23  IA-2 Antibody <5.4 U/mL 47.5 (H)  Glucose, Plasma 65 - 139 mg/dL 881  ZNT8 Antibodies <84 U/mL 257 (H)  Glutamic Acid Decarb Ab <5 IU/mL >250 (H)  C-Peptide 0.80 - 3.85 ng/mL 1.51  (H): Data is abnormally high  Chronic Diabetes Complications : Retinopathy: no. Last ophthalmology exam was done on annually, following with ophthalmology regularly.  Nephropathy: CKD Peripheral neuropathy: no Coronary artery disease: no Stroke: no  Relevant comorbidities and cardiovascular risk factors: Obesity: no Body mass index is 21.85 kg/m.  Hypertension:  Yes  Hyperlipidemia : Yes, on statin   Current / Home Diabetic regimen includes:  Lantus  /glargine 20 units when off days and 30 units on work day.  Novolog  5 units with meals 2 times a day.  Metformin  XR 2000 mg daily.  Jardiance  25 mg daily.  Ozempic  2 mg weekly. Actos  30 mg daily.  Prior diabetic medications: ?  Actos  in the past.  Glycemic data:    CONTINUOUS GLUCOSE MONITORING SYSTEM (CGMS) INTERPRETATION:               Dexcom G7 CGM-  Sensor Download (Sensor download was reviewed and summarized below.) Dates: July 18 through September 12, 2023, 14 days  Glucose Management Indicator: 6.4%  Sensor usage:92 %     Impression: - Mostly acceptable blood sugar, on most of the days overnight, in between the meals and postprandially.  Rarely mild postprandial hyperglycemia with blood sugar in the range of 200s.  Rare hypoglycemia with blood sugar in 60s, based on data review some of the hypoglycemia seems to be related to just sensor issue.  No concerning hypoglycemia.    Hypoglycemia: Patient has minor hypoglycemic episodes. Patient has hypoglycemia awareness.  Factors modifying glucose control: 1.  Diabetic diet assessment: 3 meals a day, vegetables. Burger, fries. Soft drinks. Sweet tea.  He reports he  is not good at eating proper diet especially when working eats fast food and outside food on working days.  His diet is better at home.  2.  Staying active or exercising: active at work.  3.  Medication compliance: compliant most of the time.  Sometimes he forgets to take his medication in the morning, about 1 day in a week.  Interval history  Dexcom G7 CGM data as reviewed above.  Mostly acceptable blood sugar.  He has improvement in diabetes control with hemoglobin A1c of 6.8%, congratulated him.  He started to take NovoLog  taking usually with breakfast and lunch.  Diabetes regimen as reviewed and noted above.  He has erythema and tenderness around injection site on the left  anterior abdomen wall due to injection with NovoLog .  No other complaints today.  REVIEW OF SYSTEMS As per history of present illness.   PAST MEDICAL HISTORY: Past Medical History:  Diagnosis Date   Asbestos exposure    CAD in native artery    Diabetes (HCC)    GERD (gastroesophageal reflux disease)    Hyperlipidemia LDL goal <70    Hypertension    S/P angioplasty with stent, 11/25/15 to mRCA to dRCA promus premier DES 11/26/2015   Sleep apnea 04/13/2011    PAST SURGICAL HISTORY: Past Surgical History:  Procedure Laterality Date   ANKLE ARTHROSCOPY Left    after injury   CARDIAC CATHETERIZATION N/A 11/25/2015   Procedure: Left Heart Cath and Coronary Angiography;  Surgeon: Peter M Swaziland, MD;  Location: [facility] INVASIVE CV LAB;  Service: Cardiovascular;  Laterality: N/A;   CARDIAC CATHETERIZATION N/A 11/25/2015   Procedure: Coronary Stent Intervention;  Surgeon: Peter M Swaziland, MD;  Location: [facility] INVASIVE CV LAB;  Service: Cardiovascular;  Laterality: N/A;   CORONARY ANGIOPLASTY WITH STENT PLACEMENT      ALLERGIES: No Known Allergies  FAMILY HISTORY:  Family History  Problem Relation Age of Onset   Diabetes Mellitus II Mother    Diabetes Mellitus II Father    Diabetes Father    CAD Other        Reports multiple distant relatives with history of MI    SOCIAL HISTORY: Social History   Socioeconomic History   Marital status: Married    Spouse name: Not on file   Number of children: Not on file   Years of education: Not on file   Highest education level: Not on file  Occupational History   Occupation: Social research officer, government at [employer]  Tobacco Use   Smoking status: Former    Current packs/day: 0.25    Types: Cigarettes    Passive exposure: Past   Smokeless tobacco: Never  Vaping Use   Vaping status: Never Used  Substance and Sexual Activity   Alcohol use: No   Drug use: No   Sexual activity: Yes    Birth control/protection: Other-see comments  Other Topics Concern    Not on file  Social History Narrative   He lives in [place] with his wife.   Social Drivers of Corporate investment banker Strain: Low Risk  (10/25/2022)   Overall Financial Resource Strain (CARDIA)    Difficulty of Paying Living Expenses: Not hard at all  Food Insecurity: No Food Insecurity (10/25/2022)   Hunger Vital Sign    Worried About Running Out of Food in the Last Year: Never true    Ran Out of Food in the Last Year: Never true  Transportation Needs: No Transportation Needs (10/25/2022)   PRAPARE -  Administrator, Civil Service (Medical): No    Lack of Transportation (Non-Medical): No  Physical Activity: Insufficiently Active (10/25/2022)   Exercise Vital Sign    Days of Exercise per Week: 4 days    Minutes of Exercise per Session: 30 min  Stress: No Stress Concern Present (10/25/2022)   [employer] of Occupational Health - Occupational Stress Questionnaire    Feeling of Stress : Not at all  Social Connections: Moderately Integrated (10/25/2022)   Social Connection and Isolation Panel    Frequency of Communication with Friends and Family: Twice a week    Frequency of Social Gatherings with Friends and Family: Once a week    Attends Religious Services: 1 to 4 times per year    Active Member of [employer] or Organizations: No    Attends Banker Meetings: Never    Marital Status: Married    MEDICATIONS:  Current Outpatient Medications  Medication Sig Dispense Refill   aspirin  EC 81 MG tablet Take 81 mg by mouth daily.     atorvastatin  (LIPITOR) 80 MG tablet TAKE 1 TABLET BY MOUTH EVERY DAY 90 tablet 1   B-D ULTRAFINE III SHORT PEN 31G X 8 MM MISC USE AS DIRECTED EVERY DAY 100 each 1   calcium  carbonate (TUMS - DOSED IN MG ELEMENTAL CALCIUM ) 500 MG chewable tablet Chew 1 tablet by mouth daily.     Continuous Glucose Sensor (DEXCOM G7 SENSOR) MISC 1 Device by Does not apply route continuous. 9 each 3   empagliflozin  (JARDIANCE ) 25 MG TABS tablet  Take 1 tablet (25 mg total) by mouth daily. 90 tablet 3   glucose blood (ONETOUCH VERIO) test strip Use as instructed 100 each 5   insulin  glargine (LANTUS ) 100 UNIT/ML Solostar Pen Inject under the skin 30 UNITS ON WORKDAYS AND 20 UNITS ON NONWORKING DAYS DAILY. 30 mL 1   insulin  lispro (HUMALOG  KWIKPEN) 100 UNIT/ML KwikPen Inject 5 Units into the skin 3 (three) times daily. 15 mL 11   metFORMIN  (GLUCOPHAGE -XR) 500 MG 24 hr tablet Take 4 tablets (2,000 mg total) by mouth every morning. 360 tablet 3   metoprolol  succinate (TOPROL -XL) 25 MG 24 hr tablet TAKE 1 TABLET (25 MG TOTAL) BY MOUTH DAILY. 90 tablet 3   nitroGLYCERIN  (NITROSTAT ) 0.4 MG SL tablet Place 1 tablet (0.4 mg total) under the tongue every 5 (five) minutes as needed for chest pain. 30 tablet 12   Omeprazole (PRILOSEC PO) Take by mouth.     pioglitazone  (ACTOS ) 30 MG tablet Take 1 tablet (30 mg total) by mouth daily. 90 tablet 3   Semaglutide , 2 MG/DOSE, (OZEMPIC , 2 MG/DOSE,) 8 MG/3ML SOPN Inject 2 mg subcutaneous once a week 9 mL 3   VASCEPA 1 g CAPS Take 2 capsules by mouth 2 (two) times daily.     insulin  glargine-yfgn (SEMGLEE , YFGN,) 100 UNIT/ML Pen TAKE 30 UNITS ON WORKDAYS AND 20 UNITS ON NONWORKING DAYS DAILY. (Patient not taking: Reported on 09/12/2023) 30 mL 4   No current facility-administered medications for this visit.    PHYSICAL EXAM: Vitals:   09/12/23 0925  BP: 122/70  Pulse: 86  Resp: 20  SpO2: 98%  Weight: 135 lb 6.4 oz (61.4 kg)  Height: 5' 6 (1.676 m)   Body mass index is 21.85 kg/m.  Wt Readings from Last 3 Encounters:  09/12/23 135 lb 6.4 oz (61.4 kg)  08/29/23 136 lb 1.6 oz (61.7 kg)  05/09/23 129 lb 9.6 oz (58.8 kg)  General: Well developed, well nourished male in no apparent distress.  HEENT: AT/[place], no external lesions.  Eyes: Conjunctiva clear and no icterus. Neck: Neck supple  Lungs: Respirations not labored Neurologic: Alert, oriented, normal speech Extremities / Skin: Dry.  Left  anterior abdominal wall with erythema at insulin  injection sites. Psychiatric: Does not appear depressed or anxious  Diabetic Foot Exam - Simple   No data filed    LABS Reviewed Lab Results  Component Value Date   HGBA1C 6.8 (A) 08/29/2023   HGBA1C 6.8 08/29/2023   HGBA1C 6.8 08/29/2023   No results found for: FRUCTOSAMINE Lab Results  Component Value Date   CHOL 65 (L) 11/29/2022   HDL 38 (L) 11/29/2022   LDLCALC 3 11/29/2022   TRIG 140 11/29/2022   CHOLHDL 1.7 11/29/2022   Lab Results  Component Value Date   MICRALBCREAT 9 10/25/2022   MICRALBCREAT <12 09/27/2021   Lab Results  Component Value Date   CREATININE 1.34 (H) 11/29/2022   No results found for: GFR  ASSESSMENT / PLAN  1. Uncontrolled type 1 diabetes mellitus with hyperglycemia (HCC)    Diabetes Mellitus type 2, complicated by CKD. - Diabetic status / severity: Uncontrolled.  Improving  Lab Results  Component Value Date   HGBA1C 6.8 (A) 08/29/2023   HGBA1C 6.8 08/29/2023   HGBA1C 6.8 08/29/2023    - Hemoglobin A1c goal : <6.5%  Patient was diagnosed with diabetes mellitus in 2015, was being managed as type 2 diabetes mellitus until February 2025.  In February 2025 patient has positive antibodies consistent with type 1 diabetes mellitus.  - Medications: See below.   Continue glargine/Lantus  30 units on workdays and 20 units on nonworking days daily once a day.  Stop NovoLog  due to skin irritation at injection sites.  Start Humalog  5 units with meals up to 3 times a day.  Okay to continue Jardiance , Ozempic , metformin  and pioglitazone  for now.  At some point these medicines need to be stopped as these are type II medication.  It is okay to continue for now.  Continue Jardiance  25 mg daily.  Continue Ozempic  2 mg weekly.  Continue metformin  extended release 2000 mg daily.  Continue pioglitazone /Actos  30 mg daily.  - Home glucose testing: Continue Dexcom G7 CGM and check as needed.  -  Discussed/ Gave Hypoglycemia treatment plan.  # Consult : not required at this time.    # Annual urine for microalbuminuria/ creatinine ratio, no microalbuminuria currently. Last  Lab Results  Component Value Date   MICRALBCREAT 9 10/25/2022    # Foot check nightly.  # Annual dilated diabetic eye exams.   - Diet: Make healthy diabetic food choices, discussed in detail.  Asked to avoid sweet tea.  Limit carbohydrate and calorie intake.  No frequent snacking. - Life style / activity / exercise: Discussed.  2. Blood pressure  -  BP Readings from Last 1 Encounters:  09/12/23 122/70    - Control is in target.  - No change in current plans.  3. Lipid status / Hyperlipidemia - Last  Lab Results  Component Value Date   LDLCALC 3 11/29/2022   - Continue atorvastatin  80 mg daily.  Managed by primary care provider.  Diagnoses and all orders for this visit:  Uncontrolled type 1 diabetes mellitus with hyperglycemia (HCC) -     insulin  lispro (HUMALOG  KWIKPEN) 100 UNIT/ML KwikPen; Inject 5 Units into the skin 3 (three) times daily.    DISPOSITION Follow up in clinic in  3 months suggested.   All questions answered and patient verbalized understanding of the plan.  [place] Lezlie Ritchey, MD [facility] Endocrinology [facility] [address] [place], [address] [place], [place] [address] Phone # [phone]  At least part of this note was generated using voice recognition software. Inadvertent word errors may have occurred, which were not recognized during the proofreading process.

## 2023-09-12 NOTE — Patient Instructions (Signed)
 Latest Reference Range & Units 01/17/23 08:26 05/09/23 13:47 08/29/23 08:47  Hemoglobin A1C 4.0 - 5.6 % 4.0 - 5.6 % 9.2 (H) 8.6 ! Pend 6.8 ! 6.8  (H): Data is abnormally high !: Data is abnormal

## 2023-09-13 ENCOUNTER — Other Ambulatory Visit: Payer: Self-pay | Admitting: Endocrinology

## 2023-09-13 DIAGNOSIS — E1065 Type 1 diabetes mellitus with hyperglycemia: Secondary | ICD-10-CM

## 2023-11-03 ENCOUNTER — Other Ambulatory Visit: Payer: Self-pay | Admitting: Endocrinology

## 2023-11-03 DIAGNOSIS — E1065 Type 1 diabetes mellitus with hyperglycemia: Secondary | ICD-10-CM

## 2023-12-05 ENCOUNTER — Ambulatory Visit (INDEPENDENT_AMBULATORY_CARE_PROVIDER_SITE_OTHER): Admitting: Endocrinology

## 2023-12-05 ENCOUNTER — Encounter: Payer: Self-pay | Admitting: Endocrinology

## 2023-12-05 ENCOUNTER — Ambulatory Visit: Payer: Self-pay | Admitting: Endocrinology

## 2023-12-05 ENCOUNTER — Other Ambulatory Visit

## 2023-12-05 VITALS — BP 102/60 | HR 81 | Resp 20 | Ht 66.0 in | Wt 136.8 lb

## 2023-12-05 DIAGNOSIS — E1065 Type 1 diabetes mellitus with hyperglycemia: Secondary | ICD-10-CM

## 2023-12-05 LAB — POCT GLYCOSYLATED HEMOGLOBIN (HGB A1C): Hemoglobin A1C: 6.7 % — AB (ref 4.0–5.6)

## 2023-12-05 MED ORDER — INSULIN GLARGINE-YFGN 100 UNIT/ML ~~LOC~~ SOPN: PEN_INJECTOR | SUBCUTANEOUS | 4 refills | Status: DC

## 2023-12-05 NOTE — Progress Notes (Signed)
 Outpatient Endocrinology Note [place] Devonne Lalani, MD   Patient's Name: Christopher Cooper    DOB: [birth year]    MRN: [id number]                                                    REASON OF VISIT: Follow up for type 1 diabetes mellitus  REFERRING PROVIDER: de [place], Quintin PARAS, MD  PCP: de [place], Quintin PARAS, MD  HISTORY OF PRESENT ILLNESS:   Christopher Cooper is a 63 y.o. old male with past medical history listed below, is here for follow-up for type 1 diabetes mellitus.   Pertinent Diabetes History: Patient was diagnosed with diabetes mellitus in 2015.  Patient has uncontrolled diabetes mellitus with hemoglobin A1c mostly in the range of 9% and was referred to endocrinology for further evaluation and management, initial consult in February 2025.  Patient was being managed as type 2 diabetes mellitus until February 2025.  Type I autoimmune diabetes panel was checked in March 28, 2023, showed elevated IA 2 antibodies 47, zinc transporter 8 antibody 47, GAD 65 antibody more than 250 consistent with type 1 diabetes mellitus.  History of DKA or diabetes related hospitalizations: none  Previous diabetes education: Yes   Family h/o diabetes mellitus: father type 2, cousin with type 1 diabetes.    No personal history of pancreatitis and / or family history of medullary thyroid carcinoma or MEN 2B syndrome.    Latest Reference Range & Units 03/28/23 10:23  IA-2 Antibody <5.4 U/mL 47.5 (H)  Glucose, Plasma 65 - 139 mg/dL 881  ZNT8 Antibodies <84 U/mL 257 (H)  Glutamic Acid Decarb Ab <5 IU/mL >250 (H)  C-Peptide 0.80 - 3.85 ng/mL 1.51  (H): Data is abnormally high  Chronic Diabetes Complications : Retinopathy: no. Last ophthalmology exam was done on annually, following with ophthalmology regularly.  Nephropathy: CKD Peripheral neuropathy: no Coronary artery disease: no Stroke: no  Relevant comorbidities and cardiovascular risk factors: Obesity: no Body mass index is 22.08 kg/m.  Hypertension:  Yes  Hyperlipidemia : Yes, on statin   Current / Home Diabetic regimen includes:  Lantus  /glargine 20 units when off days and 30 units on work day.  Humalog  5 -7 units with meals 2 times a day.  Metformin  XR 2000 mg daily.  Jardiance  25 mg daily.  Ozempic  2 mg weekly. Actos  30 mg daily.  Prior diabetic medications: NovoLog  was stopped in the past due to concern for injection site irritation.  However later he mentioned it turned out to be shingles.  Glycemic data:    CONTINUOUS GLUCOSE MONITORING SYSTEM (CGMS) INTERPRETATION:               Dexcom G7 CGM-  Sensor Download (Sensor download was reviewed and summarized below.) Dates: October 10 to October 23 , 2025, 14 days    Previous :     Impression: Mostly acceptable blood sugar with occasional mild hypoglycemia overnight and in between the meals with blood sugar in 60s, reports related to not eating for prolonged period of time..  Rare mild hyperglycemia with blood sugar in low 200s related to lunch.  No concerning hyperglycemia.    Hypoglycemia: Patient has minor hypoglycemic episodes. Patient has hypoglycemia awareness.  Factors modifying glucose control: 1.  Diabetic diet assessment: 3 meals a day, vegetables. Burger, fries. Soft drinks. Sweet tea.  He reports he is not good at eating proper diet especially when working eats fast food and outside food on working days.  His diet is better at home.  2.  Staying active or exercising: active at work.  3.  Medication compliance: compliant most of the time.  Sometimes he forgets to take his medication in the morning, about 1 day in a week.  Interval history  Dexcom G7 CGM data as reviewed above.  Occasional mild hypoglycemia otherwise mostly settable blood sugar.  Hemoglobin A1c 6.7%.  Diabetes regimen as reviewed and noted above.  He has no other complaints today.  REVIEW OF SYSTEMS As per history of present illness.   PAST MEDICAL HISTORY: Past Medical History:   Diagnosis Date   Asbestos exposure    CAD in native artery    Diabetes (HCC)    GERD (gastroesophageal reflux disease)    Hyperlipidemia LDL goal <70    Hypertension    S/P angioplasty with stent, 11/25/15 to mRCA to dRCA promus premier DES 11/26/2015   Sleep apnea 04/13/2011    PAST SURGICAL HISTORY: Past Surgical History:  Procedure Laterality Date   ANKLE ARTHROSCOPY Left    after injury   CARDIAC CATHETERIZATION N/A 11/25/2015   Procedure: Left Heart Cath and Coronary Angiography;  Surgeon: Peter M Swaziland, MD;  Location: [facility] INVASIVE CV LAB;  Service: Cardiovascular;  Laterality: N/A;   CARDIAC CATHETERIZATION N/A 11/25/2015   Procedure: Coronary Stent Intervention;  Surgeon: Peter M Swaziland, MD;  Location: [facility] INVASIVE CV LAB;  Service: Cardiovascular;  Laterality: N/A;   CORONARY ANGIOPLASTY WITH STENT PLACEMENT      ALLERGIES: No Known Allergies  FAMILY HISTORY:  Family History  Problem Relation Age of Onset   Diabetes Mellitus II Mother    Diabetes Mellitus II Father    Diabetes Father    CAD Other        Reports multiple distant relatives with history of MI    SOCIAL HISTORY: Social History   Socioeconomic History   Marital status: Married    Spouse name: Not on file   Number of children: Not on file   Years of education: Not on file   Highest education level: Not on file  Occupational History   Occupation: Social research officer, government at [employer]  Tobacco Use   Smoking status: Former    Current packs/day: 0.25    Types: Cigarettes    Passive exposure: Past   Smokeless tobacco: Never  Vaping Use   Vaping status: Never Used  Substance and Sexual Activity   Alcohol use: No   Drug use: No   Sexual activity: Yes    Birth control/protection: Other-see comments  Other Topics Concern   Not on file  Social History Narrative   He lives in [place] with his wife.   Social Drivers of Corporate investment banker Strain: Low Risk  (10/25/2022)   Overall Financial  Resource Strain (CARDIA)    Difficulty of Paying Living Expenses: Not hard at all  Food Insecurity: No Food Insecurity (10/25/2022)   Hunger Vital Sign    Worried About Running Out of Food in the Last Year: Never true    Ran Out of Food in the Last Year: Never true  Transportation Needs: No Transportation Needs (10/25/2022)   PRAPARE - Administrator, Civil Service (Medical): No    Lack of Transportation (Non-Medical): No  Physical Activity: Insufficiently Active (10/25/2022)   Exercise Vital Sign    Days  of Exercise per Week: 4 days    Minutes of Exercise per Session: 30 min  Stress: No Stress Concern Present (10/25/2022)   [employer] of Occupational Health - Occupational Stress Questionnaire    Feeling of Stress : Not at all  Social Connections: Moderately Integrated (10/25/2022)   Social Connection and Isolation Panel    Frequency of Communication with Friends and Family: Twice a week    Frequency of Social Gatherings with Friends and Family: Once a week    Attends Religious Services: 1 to 4 times per year    Active Member of [employer] or Organizations: No    Attends Banker Meetings: Never    Marital Status: Married    MEDICATIONS:  Current Outpatient Medications  Medication Sig Dispense Refill   aspirin  EC 81 MG tablet Take 81 mg by mouth daily.     atorvastatin  (LIPITOR) 80 MG tablet TAKE 1 TABLET BY MOUTH EVERY DAY 90 tablet 1   B-D ULTRAFINE III SHORT PEN 31G X 8 MM MISC USE AS DIRECTED EVERY DAY 100 each 1   calcium  carbonate (TUMS - DOSED IN MG ELEMENTAL CALCIUM ) 500 MG chewable tablet Chew 1 tablet by mouth daily.     Continuous Glucose Sensor (DEXCOM G7 SENSOR) MISC 1 Device by Does not apply route continuous. 9 each 3   empagliflozin  (JARDIANCE ) 25 MG TABS tablet Take 1 tablet (25 mg total) by mouth daily. 90 tablet 3   glucose blood (ONETOUCH VERIO) test strip Use as instructed 100 each 5   insulin  lispro (HUMALOG  KWIKPEN) 100 UNIT/ML KwikPen  Inject 5 Units into the skin 3 (three) times daily. 15 mL 11   metFORMIN  (GLUCOPHAGE -XR) 500 MG 24 hr tablet Take 4 tablets (2,000 mg total) by mouth every morning. 360 tablet 3   metoprolol  succinate (TOPROL -XL) 25 MG 24 hr tablet TAKE 1 TABLET (25 MG TOTAL) BY MOUTH DAILY. 90 tablet 3   nitroGLYCERIN  (NITROSTAT ) 0.4 MG SL tablet Place 1 tablet (0.4 mg total) under the tongue every 5 (five) minutes as needed for chest pain. 30 tablet 12   Omeprazole (PRILOSEC PO) Take by mouth.     pioglitazone  (ACTOS ) 30 MG tablet Take 1 tablet (30 mg total) by mouth daily. 90 tablet 3   Semaglutide , 2 MG/DOSE, (OZEMPIC , 2 MG/DOSE,) 8 MG/3ML SOPN Inject 2 mg subcutaneous once a week 9 mL 3   VASCEPA 1 g CAPS Take 2 capsules by mouth 2 (two) times daily.     insulin  glargine-yfgn (SEMGLEE , YFGN,) 100 UNIT/ML Pen TAKE 28 UNITS ON WORKDAYS AND 20 UNITS ON NONWORKING DAYS DAILY. 30 mL 4   No current facility-administered medications for this visit.    PHYSICAL EXAM: Vitals:   12/05/23 1410  BP: 102/60  Pulse: 81  Resp: 20  SpO2: 96%  Weight: 136 lb 12.8 oz (62.1 kg)  Height: 5' 6 (1.676 m)    Body mass index is 22.08 kg/m.  Wt Readings from Last 3 Encounters:  12/05/23 136 lb 12.8 oz (62.1 kg)  09/12/23 135 lb 6.4 oz (61.4 kg)  08/29/23 136 lb 1.6 oz (61.7 kg)    General: Well developed, well nourished male in no apparent distress.  HEENT: AT/[place], no external lesions.  Eyes: Conjunctiva clear and no icterus. Neck: Neck supple  Lungs: Respirations not labored Neurologic: Alert, oriented, normal speech Extremities / Skin: Dry.   Psychiatric: Does not appear depressed or anxious  Diabetic Foot Exam - Simple   No data filed  LABS Reviewed Lab Results  Component Value Date   HGBA1C 6.7 (A) 12/05/2023   HGBA1C 6.8 (A) 08/29/2023   HGBA1C 6.8 08/29/2023   HGBA1C 6.8 08/29/2023   No results found for: FRUCTOSAMINE Lab Results  Component Value Date   CHOL 65 (L) 11/29/2022   HDL 38  (L) 11/29/2022   LDLCALC 3 11/29/2022   TRIG 140 11/29/2022   CHOLHDL 1.7 11/29/2022   Lab Results  Component Value Date   MICRALBCREAT 9 10/25/2022   MICRALBCREAT <12 09/27/2021   Lab Results  Component Value Date   CREATININE 1.34 (H) 11/29/2022   No results found for: GFR  ASSESSMENT / PLAN  1. Uncontrolled type 1 diabetes mellitus with hyperglycemia (HCC)   2. Type 1 diabetes mellitus with hyperglycemia (HCC)     Diabetes Mellitus type 2, complicated by CKD. - Diabetic status / severity: Fair control.  Lab Results  Component Value Date   HGBA1C 6.7 (A) 12/05/2023    - Hemoglobin A1c goal : <6.5%  Patient was diagnosed with diabetes mellitus in 2015, was being managed as type 2 diabetes mellitus until February 2025.  In February 2025 patient has positive antibodies consistent with type 1 diabetes mellitus.  Due to occasional hypoglycemia like to decrease the dose of basal insulin .  - Medications: See below.   Decrease glargine/Lantus  from 30 units to 28 units on workdays and continue 20 units on nonworking days daily once a day.  Continue Humalog  5 -7 units with meals up to 3 times a day.  Okay to continue Jardiance , Ozempic , metformin  and pioglitazone  for now.  At some point these medicines need to be stopped as these are type II medication.  It is okay to continue for now.  Continue Jardiance  25 mg daily.  Continue Ozempic  2 mg weekly.  Continue metformin  extended release 2000 mg daily.  Continue pioglitazone /Actos  30 mg daily.  - Home glucose testing: Continue Dexcom G7 CGM and check as needed.  - Discussed/ Gave Hypoglycemia treatment plan.  # Consult : not required at this time.    # Annual urine for microalbuminuria/ creatinine ratio, no microalbuminuria currently.  Will check today. Last  Lab Results  Component Value Date   MICRALBCREAT 9 10/25/2022    # Foot check nightly.  # Annual dilated diabetic eye exams.   - Diet: Make healthy  diabetic food choices, discussed in detail.  Asked to avoid sweet tea.  Limit carbohydrate and calorie intake.  No frequent snacking. - Life style / activity / exercise: Discussed.  2. Blood pressure  -  BP Readings from Last 1 Encounters:  12/05/23 102/60    - Control is in target.  - No change in current plans.  3. Lipid status / Hyperlipidemia - Last  Lab Results  Component Value Date   LDLCALC 3 11/29/2022   - Continue atorvastatin  80 mg daily.  Managed by primary care provider.  Diagnoses and all orders for this visit:  Uncontrolled type 1 diabetes mellitus with hyperglycemia (HCC) -     POCT glycosylated hemoglobin (Hb A1C) -     Basic metabolic panel with GFR -     Microalbumin / creatinine urine ratio  Type 1 diabetes mellitus with hyperglycemia (HCC) -     Discontinue: insulin  glargine-yfgn (SEMGLEE , YFGN,) 100 UNIT/ML Pen; TAKE 28 UNITS ON WORKDAYS AND 20 UNITS ON NONWORKING DAYS DAILY. -     insulin  glargine-yfgn (SEMGLEE , YFGN,) 100 UNIT/ML Pen; TAKE 28 UNITS ON WORKDAYS AND 20 UNITS ON  NONWORKING DAYS DAILY.     DISPOSITION Follow up in clinic in 3 months suggested.  Labs today as ordered.   All questions answered and patient verbalized understanding of the plan.  [place] Embree Brawley, MD [facility] Endocrinology [facility] [address] [place], [address] [place], [place] [address] Phone # [phone]  At least part of this note was generated using voice recognition software. Inadvertent word errors may have occurred, which were not recognized during the proofreading process.

## 2023-12-06 ENCOUNTER — Encounter: Payer: Self-pay | Admitting: Endocrinology

## 2023-12-06 LAB — MICROALBUMIN / CREATININE URINE RATIO
Creatinine, Urine: 52 mg/dL (ref 20–320)
Microalb, Ur: <0.2 mg/dL

## 2023-12-06 LAB — BASIC METABOLIC PANEL WITH GFR
BUN/Creatinine Ratio: 10 (calc) (ref 6–22)
BUN: 15 mg/dL (ref 7–25)
CO2: 28 mmol/L (ref 20–32)
Calcium: 9.4 mg/dL (ref 8.6–10.3)
Chloride: 100 mmol/L (ref 98–110)
Creat: 1.47 mg/dL — ABNORMAL HIGH (ref 0.70–1.35)
Glucose, Bld: 127 mg/dL — ABNORMAL HIGH (ref 65–99)
Potassium: 4.2 mmol/L (ref 3.5–5.3)
Sodium: 137 mmol/L (ref 135–146)
eGFR: 54 mL/min/1.73m2 — ABNORMAL LOW (ref >=60)

## 2023-12-19 ENCOUNTER — Ambulatory Visit: Admitting: Endocrinology

## 2023-12-19 ENCOUNTER — Other Ambulatory Visit (HOSPITAL_COMMUNITY): Payer: Self-pay

## 2024-02-07 ENCOUNTER — Encounter (HOSPITAL_BASED_OUTPATIENT_CLINIC_OR_DEPARTMENT_OTHER): Payer: Self-pay | Admitting: Family Medicine

## 2024-02-07 ENCOUNTER — Ambulatory Visit (HOSPITAL_BASED_OUTPATIENT_CLINIC_OR_DEPARTMENT_OTHER): Admitting: Family Medicine

## 2024-02-07 VITALS — BP 102/68 | HR 78 | Ht 66.0 in | Wt 134.0 lb

## 2024-02-07 DIAGNOSIS — Z23 Encounter for immunization: Secondary | ICD-10-CM | POA: Diagnosis not present

## 2024-02-07 DIAGNOSIS — Z0000 Encounter for general adult medical examination without abnormal findings: Secondary | ICD-10-CM | POA: Diagnosis not present

## 2024-02-07 LAB — LIPID PANEL
Chol/HDL Ratio: 1.6 ratio (ref 0.0–5.0)
Cholesterol, Total: 69 mg/dL — ABNORMAL LOW (ref 100–199)
HDL: 44 mg/dL
LDL Chol Calc (NIH): 10 mg/dL (ref 0–99)
Triglycerides: 61 mg/dL (ref 0–149)
VLDL Cholesterol Cal: 15 mg/dL (ref 5–40)

## 2024-02-07 LAB — COMPREHENSIVE METABOLIC PANEL WITH GFR
ALT: 11 IU/L (ref 0–44)
AST: 18 IU/L (ref 0–40)
Albumin: 4.2 g/dL (ref 3.9–4.9)
Alkaline Phosphatase: 79 IU/L (ref 47–123)
BUN/Creatinine Ratio: 11 (ref 10–24)
BUN: 16 mg/dL (ref 8–27)
Bilirubin Total: 0.5 mg/dL (ref 0.0–1.2)
CO2: 23 mmol/L (ref 20–29)
Calcium: 9.3 mg/dL (ref 8.6–10.2)
Chloride: 103 mmol/L (ref 96–106)
Creatinine, Ser: 1.41 mg/dL — ABNORMAL HIGH (ref 0.76–1.27)
Globulin, Total: 2.4 g/dL (ref 1.5–4.5)
Glucose: 90 mg/dL (ref 70–99)
Potassium: 4.5 mmol/L (ref 3.5–5.2)
Sodium: 140 mmol/L (ref 134–144)
Total Protein: 6.6 g/dL (ref 6.0–8.5)
eGFR: 57 mL/min/1.73 — ABNORMAL LOW

## 2024-02-07 LAB — CBC WITH DIFFERENTIAL/PLATELET
Basophils Absolute: 0.1 x10E3/uL (ref 0.0–0.2)
Basos: 1 %
EOS (ABSOLUTE): 0.2 x10E3/uL (ref 0.0–0.4)
Eos: 2 %
Hematocrit: 42.1 % (ref 37.5–51.0)
Hemoglobin: 13.3 g/dL (ref 13.0–17.7)
Immature Grans (Abs): 0 x10E3/uL (ref 0.0–0.1)
Immature Granulocytes: 0 %
Lymphocytes Absolute: 2.5 x10E3/uL (ref 0.7–3.1)
Lymphs: 29 %
MCH: 29 pg (ref 26.6–33.0)
MCHC: 31.6 g/dL (ref 31.5–35.7)
MCV: 92 fL (ref 79–97)
Monocytes Absolute: 0.7 x10E3/uL (ref 0.1–0.9)
Monocytes: 8 %
Neutrophils Absolute: 5.1 x10E3/uL (ref 1.4–7.0)
Neutrophils: 60 %
Platelets: 303 x10E3/uL (ref 150–450)
RBC: 4.59 x10E6/uL (ref 4.14–5.80)
RDW: 12.8 % (ref 11.6–15.4)
WBC: 8.5 x10E3/uL (ref 3.4–10.8)

## 2024-02-07 LAB — TSH RFX ON ABNORMAL TO FREE T4: TSH: 2.38 u[IU]/mL (ref 0.450–4.500)

## 2024-02-07 NOTE — Progress Notes (Signed)
 " Subjective:    CC: Annual Physical Exam  HPI: Christopher Cooper is a 63 y.o. presenting for annual physical.  Reports having rash a few months ago. Thought maybe was related to reaction from insulin , but rash persisted. Some pain with it. Has been slowly improving. Did not seek evaluation of rash.  I reviewed the past medical history, family history, social history, surgical history, and allergies today and no changes were needed.  Please see the problem list section below in epic for further details.  Past Medical History: Past Medical History:  Diagnosis Date   Asbestos exposure    CAD in native artery    Diabetes (HCC)    GERD (gastroesophageal reflux disease)    Hyperlipidemia LDL goal <70    Hypertension    S/P angioplasty with stent, 11/25/15 to mRCA to dRCA promus premier DES 11/26/2015   Sleep apnea 04/13/2011   Past Surgical History: Past Surgical History:  Procedure Laterality Date   ANKLE ARTHROSCOPY Left    after injury   CARDIAC CATHETERIZATION N/A 11/25/2015   Procedure: Left Heart Cath and Coronary Angiography;  Surgeon: Peter M Jordan, MD;  Location: [facility] INVASIVE CV LAB;  Service: Cardiovascular;  Laterality: N/A;   CARDIAC CATHETERIZATION N/A 11/25/2015   Procedure: Coronary Stent Intervention;  Surgeon: Peter M Jordan, MD;  Location: [facility] INVASIVE CV LAB;  Service: Cardiovascular;  Laterality: N/A;   CORONARY ANGIOPLASTY WITH STENT PLACEMENT     Social History: Social History   Socioeconomic History   Marital status: Married    Spouse name: Not on file   Number of children: Not on file   Years of education: Not on file   Highest education level: Not on file  Occupational History   Occupation: Social Research Officer, Government at [employer]  Tobacco Use   Smoking status: Former    Current packs/day: 0.25    Types: Cigarettes    Passive exposure: Past   Smokeless tobacco: Never  Vaping Use   Vaping status: Never Used  Substance and Sexual Activity   Alcohol use: No   Drug  use: No   Sexual activity: Yes    Birth control/protection: Other-see comments  Other Topics Concern   Not on file  Social History Narrative   He lives in [place] with his wife.   Social Drivers of Health   Tobacco Use: Medium Risk (02/07/2024)   Patient History    Smoking Tobacco Use: Former    Smokeless Tobacco Use: Never    Passive Exposure: Past  Physicist, Medical Strain: Low Risk (10/25/2022)   Overall Financial Resource Strain (CARDIA)    Difficulty of Paying Living Expenses: Not hard at all  Food Insecurity: No Food Insecurity (10/25/2022)   Hunger Vital Sign    Worried About Running Out of Food in the Last Year: Never true    Ran Out of Food in the Last Year: Never true  Transportation Needs: No Transportation Needs (10/25/2022)   PRAPARE - Administrator, Civil Service (Medical): No    Lack of Transportation (Non-Medical): No  Physical Activity: Insufficiently Active (10/25/2022)   Exercise Vital Sign    Days of Exercise per Week: 4 days    Minutes of Exercise per Session: 30 min  Stress: No Stress Concern Present (10/25/2022)   [employer] of Occupational Health - Occupational Stress Questionnaire    Feeling of Stress : Not at all  Social Connections: Moderately Integrated (10/25/2022)   Social Connection and Isolation Panel  Frequency of Communication with Friends and Family: Twice a week    Frequency of Social Gatherings with Friends and Family: Once a week    Attends Religious Services: 1 to 4 times per year    Active Member of Clubs or Organizations: No    Attends Banker Meetings: Never    Marital Status: Married  Depression (PHQ2-9): Low Risk (02/07/2024)   Depression (PHQ2-9)    PHQ-2 Score: 0  Alcohol Screen: Low Risk (10/25/2022)   Alcohol Screen    Last Alcohol Screening Score (AUDIT): 0  Housing: Low Risk (10/25/2022)   Housing    Last Housing Risk Score: 0  Utilities: Not At Risk (10/25/2022)   AHC Utilities     Threatened with loss of utilities: No  Health Literacy: Adequate Health Literacy (10/25/2022)   B1300 Health Literacy    Frequency of need for help with medical instructions: Never   Family History: Family History  Problem Relation Age of Onset   Diabetes Mellitus II Mother    Diabetes Mellitus II Father    Diabetes Father    CAD Other        Reports multiple distant relatives with history of MI   Allergies: Allergies[1] Medications: See med rec.  Review of Systems: No headache, visual changes, nausea, vomiting, diarrhea, constipation, dizziness, abdominal pain, skin rash, fevers, chills, night sweats, swollen lymph nodes, weight loss, chest pain, body aches, joint swelling, muscle aches, shortness of breath, mood changes, visual or auditory hallucinations.  Objective:    BP 102/68 (BP Location: Right Arm, Patient Position: Sitting, Cuff Size: Normal)   Pulse 78   Ht 5' 6 (1.676 m)   Wt 134 lb (60.8 kg)   SpO2 100%   BMI 21.63 kg/m   General: Well Developed, well nourished, and in no acute distress.  Neuro: Alert and oriented x3, extra-ocular muscles intact, sensation grossly intact. Cranial nerves II through XII are intact, motor, sensory, and coordinative functions are all intact. HEENT: Normocephalic, atraumatic, pupils equal round reactive to light, neck supple, no masses, no lymphadenopathy, thyroid nonpalpable. Oropharynx, nasopharynx, external ear canals are unremarkable. Skin: Warm and dry, no rashes noted.  Cardiac: Regular rate and rhythm, no murmurs rubs or gallops.  Respiratory: Clear to auscultation bilaterally. Not using accessory muscles, speaking in full sentences.  Abdominal: Soft, nontender, nondistended, positive bowel sounds, no masses, no organomegaly.  Musculoskeletal: Shoulder, elbow, wrist, hip, knee, ankle stable, and with full range of motion.  Impression and Recommendations:    Wellness examination Assessment & Plan: Routine HCM labs ordered. HCM  reviewed/discussed. Anticipatory guidance regarding healthy weight, lifestyle and choices given. Recommend healthy diet.  Recommend approximately 150 minutes/week of moderate intensity exercise Recommend regular dental and vision exams Always use seatbelt/lap and shoulder restraints Recommend using smoke alarms and checking batteries at least twice a year Recommend using sunscreen when outside Discussed colon cancer screening recommendations, options.  Had Cologuard completed, was positive, still has not scheduled appointment with GI - phone number provided today. Discussed recommendations for shingles vaccine Discussed immunization recommendations  Orders: -     TSH Rfx on Abnormal to Free T4 -     Lipid panel -     Comprehensive metabolic panel with GFR -     CBC with Differential/Platelet  Encounter for immunization -     Flu vaccine HIGH DOSE PF(Fluzone Trivalent)  Return in about 6 months (around 08/07/2024) for diabetes, hypertension, hyperlipidemia.   ___________________________________________ Jerae Izard de [place], MD, ABFM, CAQSM Primary  Care and Sports Medicine [facility]    [1] No Known Allergies  "

## 2024-02-07 NOTE — Patient Instructions (Addendum)
" °  Medication Instructions:  Your physician recommends that you continue on your current medications as directed. Please refer to the Current Medication list given to you today. --If you need a refill on any your medications before your next appointment, please call your pharmacy first. If no refills are authorized on file call the office.-- Lab Work: Your physician has recommended that you have lab work today: yes If you have labs (blood work) drawn today and your tests are completely normal, you will receive your results via MyChart message OR a phone call from our staff.  Please ensure you check your voicemail in the event that you authorized detailed messages to be left on a delegated number. If you have any lab test that is abnormal or we need to change your treatment, we will call you to review the results.  Referrals/Procedures/Imaging: Phone number for [facility]: [phone]  Follow-Up: Your next appointment:   Your physician recommends that you schedule a follow-up appointment in: 6 month follow up with Dr. de [place]  You will receive a text message or e-mail with a link to a survey about your care and experience with us  today! We would greatly appreciate your feedback!   Thanks for letting us  be apart of your health journey!!  Primary Care and Sports Medicine   Dr. Quintin sheerer Cuba   We encourage you to activate your patient portal called MyChart.  Sign up information is provided on this After Visit Summary.  MyChart is used to connect with patients for Virtual Visits (Telemedicine).  Patients are able to view lab/test results, encounter notes, upcoming appointments, etc.  Non-urgent messages can be sent to your provider as well. To learn more about what you can do with MyChart, please visit --  [email or web address].    "

## 2024-02-07 NOTE — Assessment & Plan Note (Signed)
 Routine HCM labs ordered. HCM reviewed/discussed. Anticipatory guidance regarding healthy weight, lifestyle and choices given. Recommend healthy diet.  Recommend approximately 150 minutes/week of moderate intensity exercise Recommend regular dental and vision exams Always use seatbelt/lap and shoulder restraints Recommend using smoke alarms and checking batteries at least twice a year Recommend using sunscreen when outside Discussed colon cancer screening recommendations, options.  Had Cologuard completed, was positive, still has not scheduled appointment with GI - phone number provided today. Discussed recommendations for shingles vaccine Discussed immunization recommendations

## 2024-02-19 ENCOUNTER — Other Ambulatory Visit: Payer: Self-pay | Admitting: Endocrinology

## 2024-02-19 DIAGNOSIS — Z794 Long term (current) use of insulin: Secondary | ICD-10-CM

## 2024-02-25 ENCOUNTER — Telehealth: Payer: Self-pay | Admitting: Cardiology

## 2024-02-25 NOTE — Telephone Encounter (Signed)
 Spoke to patient advised if pain worsens he will need to go to ED.

## 2024-02-25 NOTE — Telephone Encounter (Signed)
 Patient is calling in about soreness that he has been feeling after having a stint put in. He said is not major but for time to time it alarms him. Please advise

## 2024-02-25 NOTE — Telephone Encounter (Signed)
 Pt called with c/o soreness for about 2-3 wk on L chest, nothing major, occurs when sitting, doesn't notice it when moving around. Pt states he is always lifting heavy stuff and is a constant soreness but doesn't notice it unless he is still and paying attention to it. Pt also states he thinks it may be coming from his stent-however stent placement was in 2017 and pt has not been seen in office since 2020. Will send to Dr Jordan for further advisement. Appointment placed for February, changed to new pt appt in January. Also ED precautions given to patient is soreness increases, develops chest pain or any other symptoms. Pt verbalizes understanding of plan.

## 2024-03-02 ENCOUNTER — Other Ambulatory Visit: Payer: Self-pay | Admitting: Endocrinology

## 2024-03-02 DIAGNOSIS — E1169 Type 2 diabetes mellitus with other specified complication: Secondary | ICD-10-CM

## 2024-03-05 NOTE — Progress Notes (Unsigned)
 " Cardiology Office Note:    Date:  03/06/2024   ID:  Christopher Cooper, DOB Aug 19, 1960, MRN [id number]  PCP:  de [place], Raymond J, MD   [facility] Providers Cardiologist:  Yaden Seith, MD     Referring MD: de [place], Quintin PARAS, MD   Chief Complaint  Patient presents with   Coronary Artery Disease    History of Present Illness:    Christopher Cooper is a 64 y.o. male seen for follow up of CAD.  He has a PMH of GERD, tobacco use, DM, remote asbestos exposure. After presenting to the hospital on 11/24/2015 with chest pain and subsequently ruled in for NSTEMI. He underwent cardiac catheterization on 11/25/2015 showed 30% proximal LAD lesion, 30% OM1 lesion, 90% mid to distal RCA lesion treated with Promus Premier 3.0 x 16 mm DES, EF 55-65% during cath.  Echocardiogram was obtained on 11/26/2015, which showed ejection fraction 55-60%, no regional wall motion abnormality, no significant valvular issue.   On follow up today  he feels well. Does note some orthostatic dizziness. Is active at work. No exertional chest pain. Does note some soreness in left breast but only notes when he is quiet and resting. He smokes occasional cigarette or vapes.   Past Medical History:  Diagnosis Date   Asbestos exposure    CAD in native artery    Diabetes (HCC)    GERD (gastroesophageal reflux disease)    Hyperlipidemia LDL goal <70    Hypertension    S/P angioplasty with stent, 11/25/15 to mRCA to dRCA promus premier DES 11/26/2015   Sleep apnea 04/13/2011    Past Surgical History:  Procedure Laterality Date   ANKLE ARTHROSCOPY Left    after injury   CARDIAC CATHETERIZATION N/A 11/25/2015   Procedure: Left Heart Cath and Coronary Angiography;  Surgeon: Javanna Patin M Raelynne Ludwick, MD;  Location: [facility] INVASIVE CV LAB;  Service: Cardiovascular;  Laterality: N/A;   CARDIAC CATHETERIZATION N/A 11/25/2015   Procedure: Coronary Stent Intervention;  Surgeon: Dymin Dingledine M Colin Norment, MD;  Location: [facility] INVASIVE CV LAB;  Service:  Cardiovascular;  Laterality: N/A;   CORONARY ANGIOPLASTY WITH STENT PLACEMENT      Current Medications: Active Medications[1]   Allergies:   Patient has no known allergies.   Social History   Socioeconomic History   Marital status: Married    Spouse name: Not on file   Number of children: Not on file   Years of education: Not on file   Highest education level: Not on file  Occupational History   Occupation: Social Research Officer, Government at [employer]  Tobacco Use   Smoking status: Some Days    Current packs/day: 0.25    Types: Cigarettes    Passive exposure: Past   Smokeless tobacco: Never  Vaping Use   Vaping status: Never Used  Substance and Sexual Activity   Alcohol use: No   Drug use: No   Sexual activity: Yes    Birth control/protection: Other-see comments  Other Topics Concern   Not on file  Social History Narrative   He lives in [place] with his wife.   Social Drivers of Health   Tobacco Use: High Risk (03/06/2024)   Patient History    Smoking Tobacco Use: Some Days    Smokeless Tobacco Use: Never    Passive Exposure: Past  Financial Resource Strain: Low Risk (10/25/2022)   Overall Financial Resource Strain (CARDIA)    Difficulty of Paying Living Expenses: Not hard at all  Food  Insecurity: No Food Insecurity (10/25/2022)   Hunger Vital Sign    Worried About Running Out of Food in the Last Year: Never true    Ran Out of Food in the Last Year: Never true  Transportation Needs: No Transportation Needs (10/25/2022)   PRAPARE - Administrator, Civil Service (Medical): No    Lack of Transportation (Non-Medical): No  Physical Activity: Insufficiently Active (10/25/2022)   Exercise Vital Sign    Days of Exercise per Week: 4 days    Minutes of Exercise per Session: 30 min  Stress: No Stress Concern Present (10/25/2022)   [employer] of Occupational Health - Occupational Stress Questionnaire    Feeling of Stress : Not at all  Social Connections: Moderately  Integrated (10/25/2022)   Social Connection and Isolation Panel    Frequency of Communication with Friends and Family: Twice a week    Frequency of Social Gatherings with Friends and Family: Once a week    Attends Religious Services: 1 to 4 times per year    Active Member of Clubs or Organizations: No    Attends Banker Meetings: Never    Marital Status: Married  Depression (PHQ2-9): Low Risk (02/07/2024)   Depression (PHQ2-9)    PHQ-2 Score: 0  Alcohol Screen: Low Risk (10/25/2022)   Alcohol Screen    Last Alcohol Screening Score (AUDIT): 0  Housing: Low Risk (10/25/2022)   Housing    Last Housing Risk Score: 0  Utilities: Not At Risk (10/25/2022)   AHC Utilities    Threatened with loss of utilities: No  Health Literacy: Adequate Health Literacy (10/25/2022)   B1300 Health Literacy    Frequency of need for help with medical instructions: Never     Family History: The patient's family history includes CAD in an other family member; Diabetes in his father; Diabetes Mellitus II in his father and mother.  ROS:   Please see the history of present illness.     All other systems reviewed and are negative.  EKGs/Labs/Other Studies Reviewed:    The following studies were reviewed today: Cath 11/25/2015 Prox LAD to Mid LAD lesion, 30 %stenosed. Ost 1st Mrg to 1st Mrg lesion, 30 %stenosed. The left ventricular systolic function is normal. LV end diastolic pressure is normal. The left ventricular ejection fraction is 55-65% by visual estimate. A STENT PROMUS PREM MR 3.0X16 drug eluting stent was successfully placed. Mid RCA to Dist RCA lesion, 90 %stenosed. Post intervention, there is a 0% residual stenosis.   1. Single vessel obstructive CAD with ulcerated plaque/stenosis in the distal RCA. 2. Normal LV function 3. Normal LV EDP. 4. Successful stenting of the distal RCA with a DES.   Plan: DAPT for one year. Aggressive risk factor modification. Anticipate DC tomorrow.         Echo 11/26/2015 LV EF: 55% -   60% Study Conclusions   - Left ventricle: The cavity size was normal. Wall thickness was   normal. Systolic function was normal. The estimated ejection   fraction was in the range of 55% to 60%. Wall motion was normal;   there were no regional wall motion abnormalities. Left   ventricular diastolic function parameters were normal. - Aortic valve: There was no stenosis. - Mitral valve: There was no significant regurgitation. - Right ventricle: The cavity size was normal. Systolic function   was normal. - Pulmonary arteries: No complete TR doppler jet so unable to   estimate PA systolic pressure. - Inferior  vena cava: The vessel was normal in size. The   respirophasic diameter changes were in the normal range (>= 50%),   consistent with normal central venous pressure.   Impressions:   - Normal study. EKG Interpretation Date/Time:  Friday March 06 2024 09:36:57 EST Ventricular Rate:  89 PR Interval:  138 QRS Duration:  90 QT Interval:  344 QTC Calculation: 418 R Axis:   89  Text Interpretation: Normal sinus rhythm Normal ECG When compared with ECG of 26-Nov-2015 04:10, No significant change was found Confirmed by Chavy Avera [phone]) on 03/06/2024 9:40:45 AM    Recent Labs: 02/07/2024: ALT 11; BUN 16; Creatinine, Ser 1.41; Hemoglobin 13.3; Platelets 303; Potassium 4.5; Sodium 140; TSH 2.380  Recent Lipid Panel    Component Value Date/Time   CHOL 69 (L) 02/07/2024 0839   TRIG 61 02/07/2024 0839   HDL 44 02/07/2024 0839   CHOLHDL 1.6 02/07/2024 0839   CHOLHDL 2.2 01/16/2016 0802   VLDL 29 01/16/2016 0802   LDLCALC 10 02/07/2024 0839   EKG Interpretation Date/Time:  Friday March 06 2024 09:36:57 EST Ventricular Rate:  89 PR Interval:  138 QRS Duration:  90 QT Interval:  344 QTC Calculation: 418 R Axis:   89  Text Interpretation: Normal sinus rhythm Normal ECG When compared with ECG of 26-Nov-2015 04:10, No significant  change was found Confirmed by Shakeeta Godette [id number]) on 03/06/2024 9:40:45 AM    Risk Assessment/Calculations:                Physical Exam:    VS:  BP (!) 96/50 (BP Location: Left Arm, Patient Position: Sitting, Cuff Size: Normal)   Pulse 86   Ht 5' 6 (1.676 m)   Wt 132 lb 1.6 oz (59.9 kg)   SpO2 98%   BMI 21.32 kg/m     Wt Readings from Last 3 Encounters:  03/06/24 132 lb 1.6 oz (59.9 kg)  02/07/24 134 lb (60.8 kg)  12/05/23 136 lb 12.8 oz (62.1 kg)     GEN:  Well nourished, well developed in no acute distress HEENT: Normal NECK: No JVD; No carotid bruits LYMPHATICS: No lymphadenopathy CARDIAC: RRR, no murmurs, rubs, gallops RESPIRATORY:  Clear to auscultation without rales, wheezing or rhonchi  ABDOMEN: Soft, non-tender, non-distended MUSCULOSKELETAL:  No edema; No deformity  SKIN: Warm and dry NEUROLOGIC:  Alert and oriented x 3 PSYCHIATRIC:  Normal affect   ASSESSMENT:    1. Essential hypertension   2. Type 2 diabetes mellitus with hyperglycemia, without long-term current use of insulin  (HCC)   3. Pure hypercholesterolemia   4. CAD in native artery   5. S/P angioplasty with stent, 11/25/15 to mRCA to dRCA promus premier DES    PLAN:    In order of problems listed above:  CAD: s/p PCI of the distal RCA in 2017 with DES.  No significant anginal symptoms. Continue ASA and statin. Ecg is normal today   Hyperlipidemia:  LDL 10 is quite low. Will reduce lipitor to 40 mg daily.    DM2: Managed by primary care provider. Last A1c 6.7%.    Tobacco abuse: counseled on complete smoking cessation.             Medication Adjustments/Labs and Tests Ordered: Current medicines are reviewed at length with the patient today.  Concerns regarding medicines are outlined above.  Orders Placed This Encounter  Procedures   EKG 12-Lead   No orders of the defined types were placed in this encounter.   Patient  Instructions  Medication Instructions:   *If you need a  refill on your cardiac medications before your next appointment, please call your pharmacy*  Lab Work:    Testing/Procedures:   Follow-Up: At [facility], you and your health needs are our priority.  As part of our continuing mission to provide you with exceptional heart care, our providers are all part of one team.  This team includes your primary Cardiologist (physician) and Advanced Practice Providers or APPs (Physician Assistants and Nurse Practitioners) who all work together to provide you with the care you need, when you need it.  Your next appointment:      Provider:     We recommend signing up for the patient portal called MyChart.  Sign up information is provided on this After Visit Summary.  MyChart is used to connect with patients for Virtual Visits (Telemedicine).  Patients are able to view lab/test results, encounter notes, upcoming appointments, etc.  Non-urgent messages can be sent to your provider as well.   To learn more about what you can do with MyChart, go to [email or web address].             Signed, Chrishun Scheer, MD  03/06/2024 9:49 AM    [facility]     [1]  Current Meds  Medication Sig   aspirin  EC 81 MG tablet Take 81 mg by mouth daily.   atorvastatin  (LIPITOR) 80 MG tablet TAKE 1 TABLET BY MOUTH EVERY DAY   B-D ULTRAFINE III SHORT PEN 31G X 8 MM MISC USE AS DIRECTED EVERY DAY   Continuous Glucose Sensor (DEXCOM G7 SENSOR) MISC 1 Device by Does not apply route continuous.   empagliflozin  (JARDIANCE ) 25 MG TABS tablet Take 1 tablet (25 mg total) by mouth daily.   insulin  glargine-yfgn (SEMGLEE , YFGN,) 100 UNIT/ML Pen TAKE 28 UNITS ON WORKDAYS AND 20 UNITS ON NONWORKING DAYS DAILY.   insulin  lispro (HUMALOG  KWIKPEN) 100 UNIT/ML KwikPen Inject 5 Units into the skin 3 (three) times daily.   metFORMIN  (GLUCOPHAGE -XR) 500 MG 24 hr tablet Take 4 tablets (2,000 mg total) by mouth every morning.   nitroGLYCERIN  (NITROSTAT ) 0.4 MG SL  tablet Place 1 tablet (0.4 mg total) under the tongue every 5 (five) minutes as needed for chest pain.   pioglitazone  (ACTOS ) 30 MG tablet TAKE 1 TABLET BY MOUTH EVERY DAY   Semaglutide , 2 MG/DOSE, (OZEMPIC , 2 MG/DOSE,) 8 MG/3ML SOPN INJECT 2 MG SUBCUTANEOUS ONCE A WEEK   [DISCONTINUED] metoprolol  succinate (TOPROL -XL) 25 MG 24 hr tablet TAKE 1 TABLET (25 MG TOTAL) BY MOUTH DAILY.   "

## 2024-03-06 ENCOUNTER — Encounter: Payer: Self-pay | Admitting: Cardiology

## 2024-03-06 ENCOUNTER — Ambulatory Visit: Attending: Cardiology | Admitting: Cardiology

## 2024-03-06 VITALS — BP 96/50 | HR 86 | Ht 66.0 in | Wt 132.1 lb

## 2024-03-06 DIAGNOSIS — Z95820 Peripheral vascular angioplasty status with implants and grafts: Secondary | ICD-10-CM | POA: Diagnosis not present

## 2024-03-06 DIAGNOSIS — E1165 Type 2 diabetes mellitus with hyperglycemia: Secondary | ICD-10-CM | POA: Diagnosis not present

## 2024-03-06 DIAGNOSIS — E7800 Pure hypercholesterolemia, unspecified: Secondary | ICD-10-CM | POA: Diagnosis not present

## 2024-03-06 DIAGNOSIS — I2510 Atherosclerotic heart disease of native coronary artery without angina pectoris: Secondary | ICD-10-CM

## 2024-03-06 DIAGNOSIS — I10 Essential (primary) hypertension: Secondary | ICD-10-CM | POA: Diagnosis not present

## 2024-03-06 MED ORDER — ATORVASTATIN CALCIUM 40 MG PO TABS: 40.0000 mg | ORAL_TABLET | Freq: Every day | ORAL | 3 refills | Status: AC

## 2024-03-06 NOTE — Patient Instructions (Addendum)
 Medication Instructions:  Stop Toprol  Decrease Lipitor to 40 mg daily Continue all other medications *If you need a refill on your cardiac medications before your next appointment, please call your pharmacy*  Lab Work: None ordered   Testing/Procedures: None ordered  Follow-Up: At [facility], you and your health needs are our priority.  As part of our continuing mission to provide you with exceptional heart care, our providers are all part of one team.  This team includes your primary Cardiologist (physician) and Advanced Practice Providers or APPs (Physician Assistants and Nurse Practitioners) who all work together to provide you with the care you need, when you need it.  Your next appointment:  1 year   Call in Oct to schedule Jan appointment     Provider:  Dr.Jordan   We recommend signing up for the patient portal called MyChart.  Sign up information is provided on this After Visit Summary.  MyChart is used to connect with patients for Virtual Visits (Telemedicine).  Patients are able to view lab/test results, encounter notes, upcoming appointments, etc.  Non-urgent messages can be sent to your provider as well.   To learn more about what you can do with MyChart, go to [email or web address].

## 2024-03-12 ENCOUNTER — Ambulatory Visit: Payer: Self-pay | Admitting: Endocrinology

## 2024-03-12 ENCOUNTER — Ambulatory Visit (INDEPENDENT_AMBULATORY_CARE_PROVIDER_SITE_OTHER): Admitting: Endocrinology

## 2024-03-12 ENCOUNTER — Encounter: Payer: Self-pay | Admitting: Endocrinology

## 2024-03-12 VITALS — BP 118/58 | HR 84 | Ht 65.0 in

## 2024-03-12 DIAGNOSIS — E1065 Type 1 diabetes mellitus with hyperglycemia: Secondary | ICD-10-CM

## 2024-03-12 DIAGNOSIS — N1831 Chronic kidney disease, stage 3a: Secondary | ICD-10-CM | POA: Diagnosis not present

## 2024-03-12 DIAGNOSIS — E109 Type 1 diabetes mellitus without complications: Secondary | ICD-10-CM | POA: Diagnosis not present

## 2024-03-12 DIAGNOSIS — E1022 Type 1 diabetes mellitus with diabetic chronic kidney disease: Secondary | ICD-10-CM

## 2024-03-12 LAB — POCT GLYCOSYLATED HEMOGLOBIN (HGB A1C): Hemoglobin A1C: 6.7 % — AB (ref 4.0–5.6)

## 2024-03-12 MED ORDER — DEXCOM G7 SENSOR MISC: 1.0000 | 3 refills | Status: AC

## 2024-03-12 MED ORDER — METFORMIN HCL ER 500 MG PO TB24: 2000.0000 mg | ORAL_TABLET | Freq: Every morning | ORAL | 3 refills | Status: AC

## 2024-03-12 MED ORDER — PIOGLITAZONE HCL 30 MG PO TABS: 30.0000 mg | ORAL_TABLET | Freq: Every day | ORAL | 3 refills | Status: AC

## 2024-03-12 MED ORDER — INSULIN GLARGINE-YFGN 100 UNIT/ML ~~LOC~~ SOPN: PEN_INJECTOR | SUBCUTANEOUS | 4 refills | Status: AC

## 2024-03-12 MED ORDER — EMPAGLIFLOZIN 25 MG PO TABS: 25.0000 mg | ORAL_TABLET | Freq: Every day | ORAL | 3 refills | Status: AC

## 2024-03-12 NOTE — Progress Notes (Signed)
 "  Outpatient Endocrinology Note Christopher Santaella, MD   Patient's Name: Christopher Cooper    DOB: July 28, 1960    MRN: [id number]                                                    REASON OF VISIT: Follow up for type 1 diabetes mellitus  REFERRING PROVIDER: de [place], Quintin PARAS, MD  PCP: de [place], Quintin PARAS, MD  HISTORY OF PRESENT ILLNESS:   Christopher Cooper is a 64 y.o. old male with past medical history listed below, is here for follow-up for type 1 diabetes mellitus.   Pertinent Diabetes History: Patient was diagnosed with diabetes mellitus in 2015.  Patient has uncontrolled diabetes mellitus with hemoglobin A1c mostly in the range of 9% and was referred to endocrinology for further evaluation and management, initial consult in February 2025.  Patient was being managed as type 2 diabetes mellitus until February 2025.  Type I autoimmune diabetes panel was checked in March 28, 2023, showed elevated IA 2 antibodies 47, zinc transporter 8 antibody 47, GAD 65 antibody more than 250 consistent with type 1 diabetes mellitus.  History of DKA or diabetes related hospitalizations: none  Previous diabetes education: Yes   Family h/o diabetes mellitus: father type 2, cousin with type 1 diabetes.    No personal history of pancreatitis and / or family history of medullary thyroid carcinoma or MEN 2B syndrome.    Latest Reference Range & Units 03/28/23 10:23  IA-2 Antibody <5.4 U/mL 47.5 (H)  Glucose, Plasma 65 - 139 mg/dL 881  ZNT8 Antibodies <84 U/mL 257 (H)  Glutamic Acid Decarb Ab <5 IU/mL >250 (H)  C-Peptide 0.80 - 3.85 ng/mL 1.51  (H): Data is abnormally high  Chronic Diabetes Complications : Retinopathy: no. Last ophthalmology exam was done on annually, following with ophthalmology regularly.  Nephropathy: CKD Peripheral neuropathy: no Coronary artery disease: no Stroke: no  Relevant comorbidities and cardiovascular risk factors: Obesity: no Body mass index is 21.98 kg/m.  Hypertension:  Yes  Hyperlipidemia : Yes, on statin   Current / Home Diabetic regimen includes:  Semglee /glargine 20 units when off days and 28 units on work day.  Humalog  5 -7 units with meals 2 times a day.  Metformin  XR 2000 mg daily.  Jardiance  25 mg daily.  Ozempic  2 mg weekly. Actos  30 mg daily.  Prior diabetic medications: NovoLog  was stopped in the past due to concern for injection site irritation.  However later he mentioned it turned out to be shingles.  Glycemic data:    CONTINUOUS GLUCOSE MONITORING SYSTEM (CGMS) INTERPRETATION:               Dexcom G7 CGM-  Sensor Download (Sensor download was reviewed and summarized below.) Dates: January 16 to March 12, 2024, 14 days     Impression: Mostly acceptable blood sugar, rare mild hypoglycemia around noon time related to mealtime insulin  and increased physical activity with work and around midnight related to not eating enough meals for the supper.  Rare mild hyperglycemia with blood sugar in 200 related to meals.  No concerning hypo and hyperglycemia.  Hypoglycemia: Patient has minor hypoglycemic episodes. Patient has hypoglycemia awareness.  Factors modifying glucose control: 1.  Diabetic diet assessment: 3 meals a day, vegetables. Burger, fries. Soft drinks. Sweet tea.  He reports he  is not good at eating proper diet especially when working eats fast food and outside food on working days.  His diet is better at home.  2.  Staying active or exercising: active at work.  3.  Medication compliance: compliant most of the time.  Sometimes he forgets to take his medication in the morning, about 1 day in a week.  Interval history  Dexcom G7 CGM data as reviewed above.  Diabetes regimen as reviewed and noted above.  Hemoglobin A1c 6.7% today.  Denies numbness and tingling of the feet.  No vision problem.  He reports he has diabetic eye follow-up in a couple of weeks.  No other complaints today.  REVIEW OF SYSTEMS As per history of  present illness.   PAST MEDICAL HISTORY: Past Medical History:  Diagnosis Date   Asbestos exposure    CAD in native artery    Diabetes (HCC)    GERD (gastroesophageal reflux disease)    Hyperlipidemia LDL goal <70    Hypertension    S/P angioplasty with stent, 11/25/15 to mRCA to dRCA promus premier DES 11/26/2015   Sleep apnea 04/13/2011    PAST SURGICAL HISTORY: Past Surgical History:  Procedure Laterality Date   ANKLE ARTHROSCOPY Left    after injury   CARDIAC CATHETERIZATION N/A 11/25/2015   Procedure: Left Heart Cath and Coronary Angiography;  Surgeon: Peter M Jordan, MD;  Location: [facility] INVASIVE CV LAB;  Service: Cardiovascular;  Laterality: N/A;   CARDIAC CATHETERIZATION N/A 11/25/2015   Procedure: Coronary Stent Intervention;  Surgeon: Peter M Jordan, MD;  Location: [facility] INVASIVE CV LAB;  Service: Cardiovascular;  Laterality: N/A;   CORONARY ANGIOPLASTY WITH STENT PLACEMENT      ALLERGIES: No Known Allergies  FAMILY HISTORY:  Family History  Problem Relation Age of Onset   Diabetes Mellitus II Mother    Diabetes Mellitus II Father    Diabetes Father    CAD Other        Reports multiple distant relatives with history of MI    SOCIAL HISTORY: Social History   Socioeconomic History   Marital status: Married    Spouse name: Not on file   Number of children: Not on file   Years of education: Not on file   Highest education level: Not on file  Occupational History   Occupation: Social Research Officer, Government at [employer]  Tobacco Use   Smoking status: Some Days    Current packs/day: 0.25    Types: Cigarettes    Passive exposure: Past   Smokeless tobacco: Never  Vaping Use   Vaping status: Never Used  Substance and Sexual Activity   Alcohol use: No   Drug use: No   Sexual activity: Yes    Birth control/protection: Other-see comments  Other Topics Concern   Not on file  Social History Narrative   He lives in [place] with his wife.   Social Drivers of Health    Tobacco Use: High Risk (03/12/2024)   Patient History    Smoking Tobacco Use: Some Days    Smokeless Tobacco Use: Never    Passive Exposure: Past  Financial Resource Strain: Low Risk (10/25/2022)   Overall Financial Resource Strain (CARDIA)    Difficulty of Paying Living Expenses: Not hard at all  Food Insecurity: No Food Insecurity (10/25/2022)   Hunger Vital Sign    Worried About Running Out of Food in the Last Year: Never true    Ran Out of Food in the Last Year: Never true  Transportation Needs: No Transportation Needs (10/25/2022)   PRAPARE - Administrator, Civil Service (Medical): No    Lack of Transportation (Non-Medical): No  Physical Activity: Insufficiently Active (10/25/2022)   Exercise Vital Sign    Days of Exercise per Week: 4 days    Minutes of Exercise per Session: 30 min  Stress: No Stress Concern Present (10/25/2022)   [employer] of Occupational Health - Occupational Stress Questionnaire    Feeling of Stress : Not at all  Social Connections: Moderately Integrated (10/25/2022)   Social Connection and Isolation Panel    Frequency of Communication with Friends and Family: Twice a week    Frequency of Social Gatherings with Friends and Family: Once a week    Attends Religious Services: 1 to 4 times per year    Active Member of Clubs or Organizations: No    Attends Banker Meetings: Never    Marital Status: Married  Depression (PHQ2-9): Low Risk (02/07/2024)   Depression (PHQ2-9)    PHQ-2 Score: 0  Alcohol Screen: Low Risk (10/25/2022)   Alcohol Screen    Last Alcohol Screening Score (AUDIT): 0  Housing: Low Risk (10/25/2022)   Housing    Last Housing Risk Score: 0  Utilities: Not At Risk (10/25/2022)   AHC Utilities    Threatened with loss of utilities: No  Health Literacy: Adequate Health Literacy (10/25/2022)   B1300 Health Literacy    Frequency of need for help with medical instructions: Never    MEDICATIONS:  Current  Outpatient Medications  Medication Sig Dispense Refill   aspirin  EC 81 MG tablet Take 81 mg by mouth daily.     atorvastatin  (LIPITOR) 40 MG tablet Take 1 tablet (40 mg total) by mouth daily. 90 tablet 3   B-D ULTRAFINE III SHORT PEN 31G X 8 MM MISC USE AS DIRECTED EVERY DAY 100 each 1   insulin  lispro (HUMALOG  KWIKPEN) 100 UNIT/ML KwikPen Inject 5 Units into the skin 3 (three) times daily. 15 mL 11   nitroGLYCERIN  (NITROSTAT ) 0.4 MG SL tablet Place 1 tablet (0.4 mg total) under the tongue every 5 (five) minutes as needed for chest pain. 30 tablet 12   Semaglutide , 2 MG/DOSE, (OZEMPIC , 2 MG/DOSE,) 8 MG/3ML SOPN INJECT 2 MG SUBCUTANEOUS ONCE A WEEK 9 mL 3   Continuous Glucose Sensor (DEXCOM G7 SENSOR) MISC 1 Device by Does not apply route continuous. 9 each 3   empagliflozin  (JARDIANCE ) 25 MG TABS tablet Take 1 tablet (25 mg total) by mouth daily. 90 tablet 3   glucose blood (ONETOUCH VERIO) test strip Use as instructed (Patient not taking: Reported on 03/06/2024) 100 each 5   insulin  glargine-yfgn (SEMGLEE , YFGN,) 100 UNIT/ML Pen TAKE 25 UNITS ON WORKDAYS AND 20 UNITS ON NONWORKING DAYS DAILY. 30 mL 4   metFORMIN  (GLUCOPHAGE -XR) 500 MG 24 hr tablet Take 4 tablets (2,000 mg total) by mouth every morning. 360 tablet 3   pioglitazone  (ACTOS ) 30 MG tablet Take 1 tablet (30 mg total) by mouth daily. 90 tablet 3   No current facility-administered medications for this visit.    PHYSICAL EXAM: Vitals:   03/12/24 0826  BP: (!) 118/58  Pulse: 84  SpO2: 99%  Height: 5' 5 (1.651 m)    Body mass index is 21.98 kg/m.  Wt Readings from Last 3 Encounters:  03/06/24 132 lb 1.6 oz (59.9 kg)  02/07/24 134 lb (60.8 kg)  12/05/23 136 lb 12.8 oz (62.1 kg)    General: Well  developed, well nourished male in no apparent distress.  HEENT: AT/[place], no external lesions.  Eyes: Conjunctiva clear and no icterus. Neck: Neck supple  Lungs: Respirations not labored Neurologic: Alert, oriented, normal  speech Extremities / Skin: Dry.   Psychiatric: Does not appear depressed or anxious  Diabetic Foot Exam - Simple   Simple Foot Form Diabetic Foot exam was performed with the following findings: Yes 03/12/2024  8:48 AM  Visual Inspection No deformities, no ulcerations, no other skin breakdown bilaterally: Yes Sensation Testing Intact to touch and monofilament testing bilaterally: Yes Pulse Check Posterior Tibialis and Dorsalis pulse intact bilaterally: Yes Comments    LABS Reviewed Lab Results  Component Value Date   HGBA1C 6.7 (A) 03/12/2024   HGBA1C 6.7 (A) 12/05/2023   HGBA1C 6.8 (A) 08/29/2023   HGBA1C 6.8 08/29/2023   HGBA1C 6.8 08/29/2023   No results found for: FRUCTOSAMINE Lab Results  Component Value Date   CHOL 69 (L) 02/07/2024   HDL 44 02/07/2024   LDLCALC 10 02/07/2024   TRIG 61 02/07/2024   CHOLHDL 1.6 02/07/2024   Lab Results  Component Value Date   MICRALBCREAT NOTE 12/05/2023   MICRALBCREAT 9 10/25/2022   Lab Results  Component Value Date   CREATININE 1.41 (H) 02/07/2024   No results found for: GFR  ASSESSMENT / PLAN  1. Type 1 diabetes mellitus with hyperglycemia (HCC)   2. Type 1 diabetes mellitus with stage 3a chronic kidney disease (HCC)   3. Controlled type 1 diabetes mellitus (HCC)     Diabetes Mellitus type 2, complicated by CKD. - Diabetic status / severity: Fair control.  Lab Results  Component Value Date   HGBA1C 6.7 (A) 03/12/2024    - Hemoglobin A1c goal : <6.5%  Patient was diagnosed with diabetes mellitus in 2015, was being managed as type 2 diabetes mellitus until February 2025.  In February 2025 patient has positive antibodies consistent with type 1 diabetes mellitus.  Due to occasional hypoglycemia like to decrease the dose of basal insulin .  -MEDICATIONS: See below.   Decrease glargine/Lantus  from 28 units to 25 units on workdays and continue 20 units on nonworking days daily once a day.  Continue Humalog  5 -7  units with meals up to 3 times a day.  Okay to continue Jardiance , Ozempic , metformin  and pioglitazone  for now.  At some point these medicines need to be stopped as these are type II medication.  It is okay to continue for now.  Continue Jardiance  25 mg daily.  Continue Ozempic  2 mg weekly.  Continue metformin  extended release 2000 mg daily.  Continue pioglitazone /Actos  30 mg daily.  - Home glucose testing: Continue Dexcom G7 CGM and check as needed.  - Discussed/ Gave Hypoglycemia treatment plan.  # Consult : not required at this time.    # Annual urine for microalbuminuria/ creatinine ratio, no microalbuminuria currently.  He has CKD 3A. Last  Lab Results  Component Value Date   MICRALBCREAT NOTE 12/05/2023    # Foot check nightly.  # Annual dilated diabetic eye exams.   - Diet: Make healthy diabetic food choices, discussed in detail.  Asked to avoid sweet tea.  Limit carbohydrate and calorie intake.  No frequent snacking. - Life style / activity / exercise: Discussed.  2. Blood pressure  -  BP Readings from Last 1 Encounters:  03/12/24 (!) 118/58    - Control is in target.  - No change in current plans.  3. Lipid status / Hyperlipidemia - Last  Lab Results  Component Value Date   LDLCALC 10 02/07/2024   - Continue atorvastatin  80 mg daily.  Managed by primary care provider.  Christopher Cooper was seen today for diabetes.  Diagnoses and all orders for this visit:  Type 1 diabetes mellitus with hyperglycemia (HCC) -     empagliflozin  (JARDIANCE ) 25 MG TABS tablet; Take 1 tablet (25 mg total) by mouth daily. -     metFORMIN  (GLUCOPHAGE -XR) 500 MG 24 hr tablet; Take 4 tablets (2,000 mg total) by mouth every morning. -     Continuous Glucose Sensor (DEXCOM G7 SENSOR) MISC; 1 Device by Does not apply route continuous. -     pioglitazone  (ACTOS ) 30 MG tablet; Take 1 tablet (30 mg total) by mouth daily. -     insulin  glargine-yfgn (SEMGLEE , YFGN,) 100 UNIT/ML Pen; TAKE 25  UNITS ON WORKDAYS AND 20 UNITS ON NONWORKING DAYS DAILY.  Type 1 diabetes mellitus with stage 3a chronic kidney disease (HCC) -     POCT HgB A1C  Controlled type 1 diabetes mellitus (HCC)    DISPOSITION Follow up in clinic in 3 months suggested.  Labs on the same day of the visit.  All questions answered and patient verbalized understanding of the plan.  Raliyah Montella, MD [facility] Endocrinology [facility] [address] [place], [address] [place], [place] [address] Phone # [phone]  At least part of this note was generated using voice recognition software. Inadvertent word errors may have occurred, which were not recognized during the proofreading process. "

## 2024-04-03 ENCOUNTER — Ambulatory Visit: Admitting: Cardiology

## 2024-06-18 ENCOUNTER — Ambulatory Visit: Admitting: Endocrinology

## 2024-08-13 ENCOUNTER — Ambulatory Visit (HOSPITAL_BASED_OUTPATIENT_CLINIC_OR_DEPARTMENT_OTHER): Admitting: Family Medicine
